# Patient Record
Sex: Female | Born: 1995 | Race: White | Hispanic: No | Marital: Married | Smoking: Never smoker
Health system: Southern US, Community
[De-identification: ages and names within clinical notes are randomized; demographics above are authoritative.]

## PROBLEM LIST (undated history)

## (undated) DIAGNOSIS — N946 Dysmenorrhea, unspecified: Secondary | ICD-10-CM

## (undated) DIAGNOSIS — F329 Major depressive disorder, single episode, unspecified: Secondary | ICD-10-CM

## (undated) DIAGNOSIS — R519 Headache, unspecified: Secondary | ICD-10-CM

## (undated) DIAGNOSIS — Z8719 Personal history of other diseases of the digestive system: Secondary | ICD-10-CM

## (undated) DIAGNOSIS — F32A Depression, unspecified: Secondary | ICD-10-CM

## (undated) DIAGNOSIS — F419 Anxiety disorder, unspecified: Secondary | ICD-10-CM

## (undated) DIAGNOSIS — R51 Headache: Secondary | ICD-10-CM

## (undated) DIAGNOSIS — T8859XA Other complications of anesthesia, initial encounter: Secondary | ICD-10-CM

## (undated) HISTORY — PX: BREAST SURGERY: SHX581

## (undated) HISTORY — PX: GASTROPLASTY DUODENAL SWITCH: SHX1699

## (undated) HISTORY — PX: WISDOM TOOTH EXTRACTION: SHX21

## (undated) HISTORY — DX: Headache: R51

## (undated) HISTORY — PX: ABDOMINAL SURGERY: SHX537

## (undated) HISTORY — DX: Headache, unspecified: R51.9

---
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
## 2013-12-01 HISTORY — PX: BREAST REDUCTION SURGERY: SHX8

## 2014-05-18 IMAGING — CR DG CLAVICLE*R*
1 series · 3 of 3 positions shown · non-contrast
Comparison: none

REASON FOR EXAM: injury
COMMENTS:

PROCEDURE:     MDR - MDR CLAVICLE RIGHT  - November 06, 2012 [DATE]
RESULT:     Right clavicle images demonstrate no fracture, dislocation or
radiopaque foreign body.

[Series 1: ap/pa · 0.17mm/px · 3 of 3 slices shown]
[im 1/3]
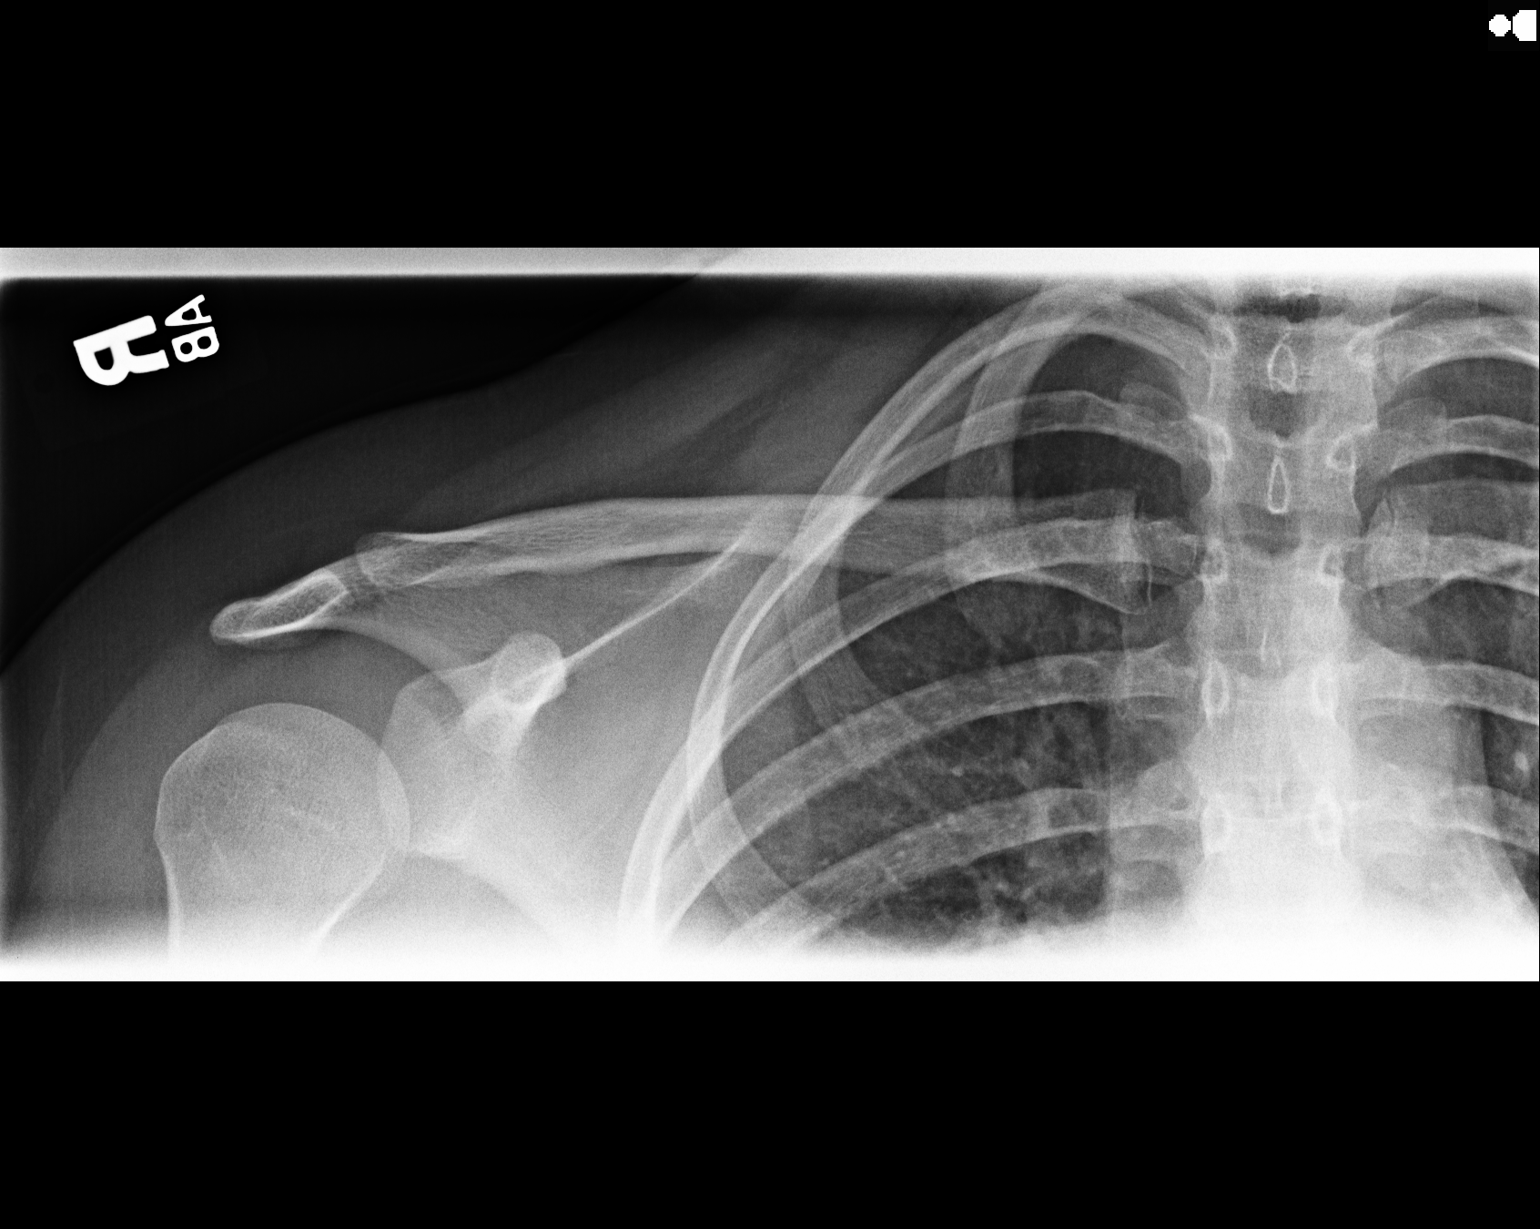
[im 2/3]
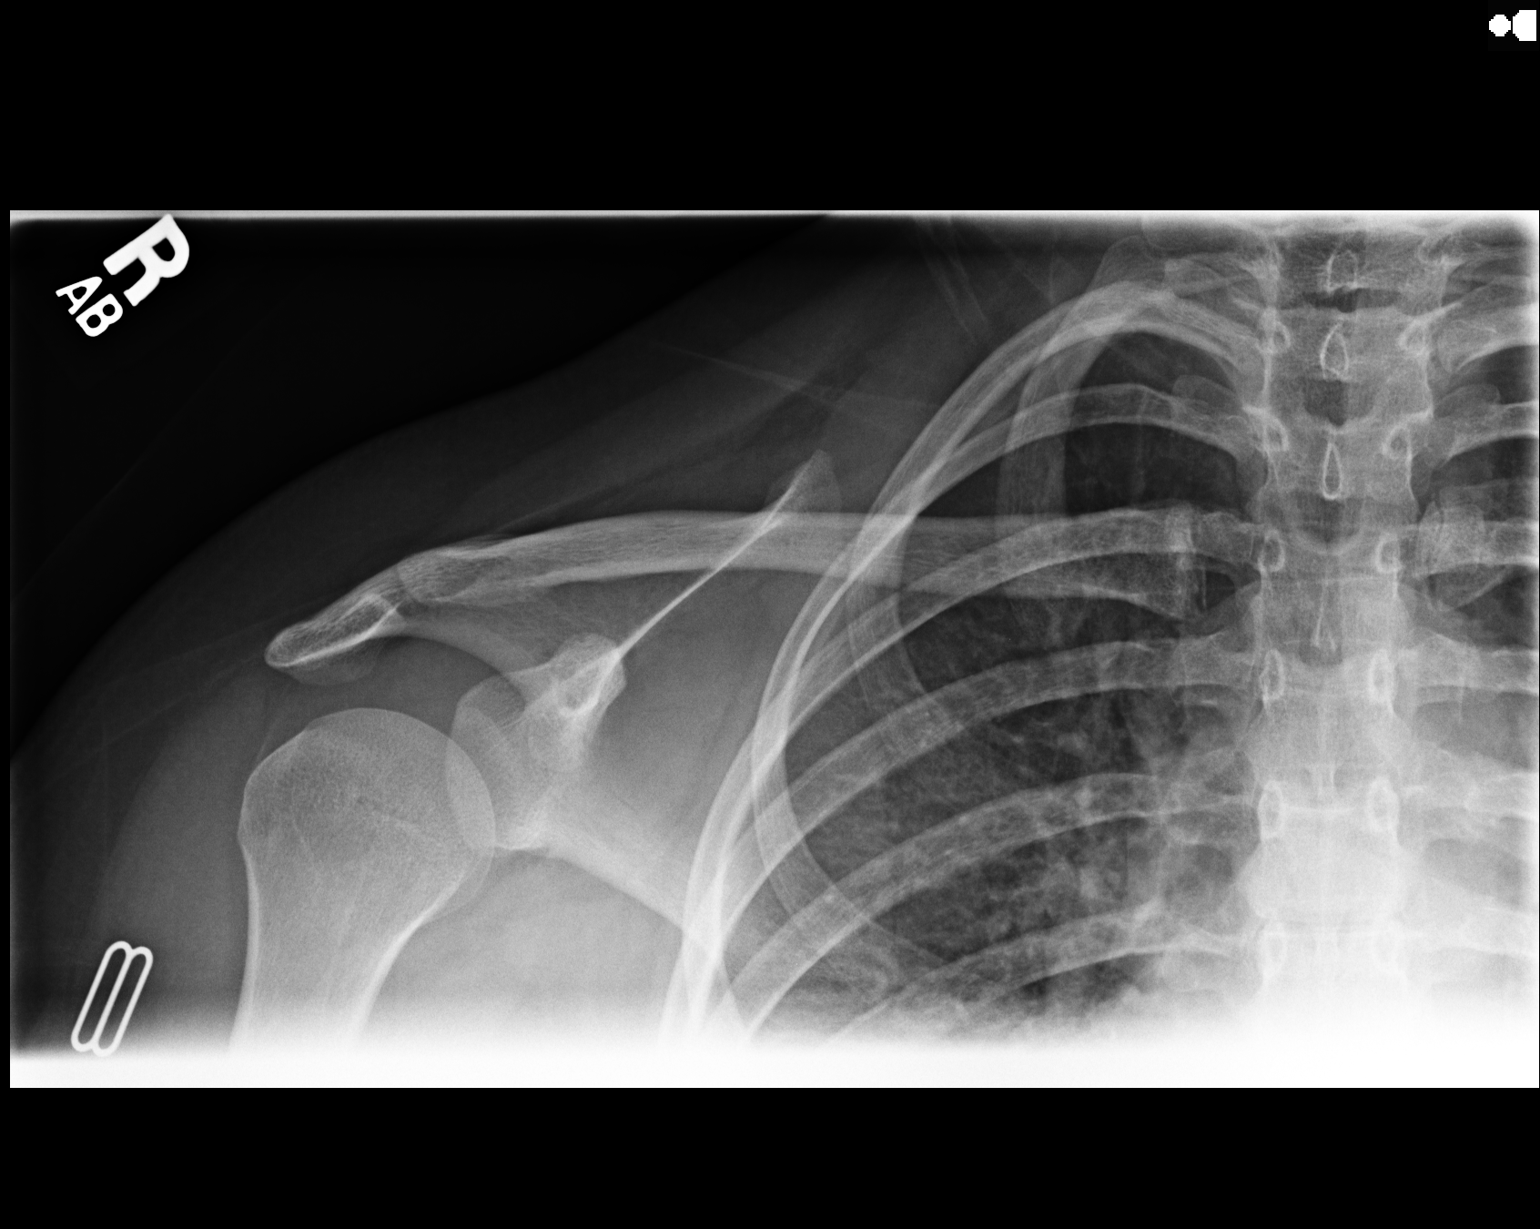
[im 3/3]
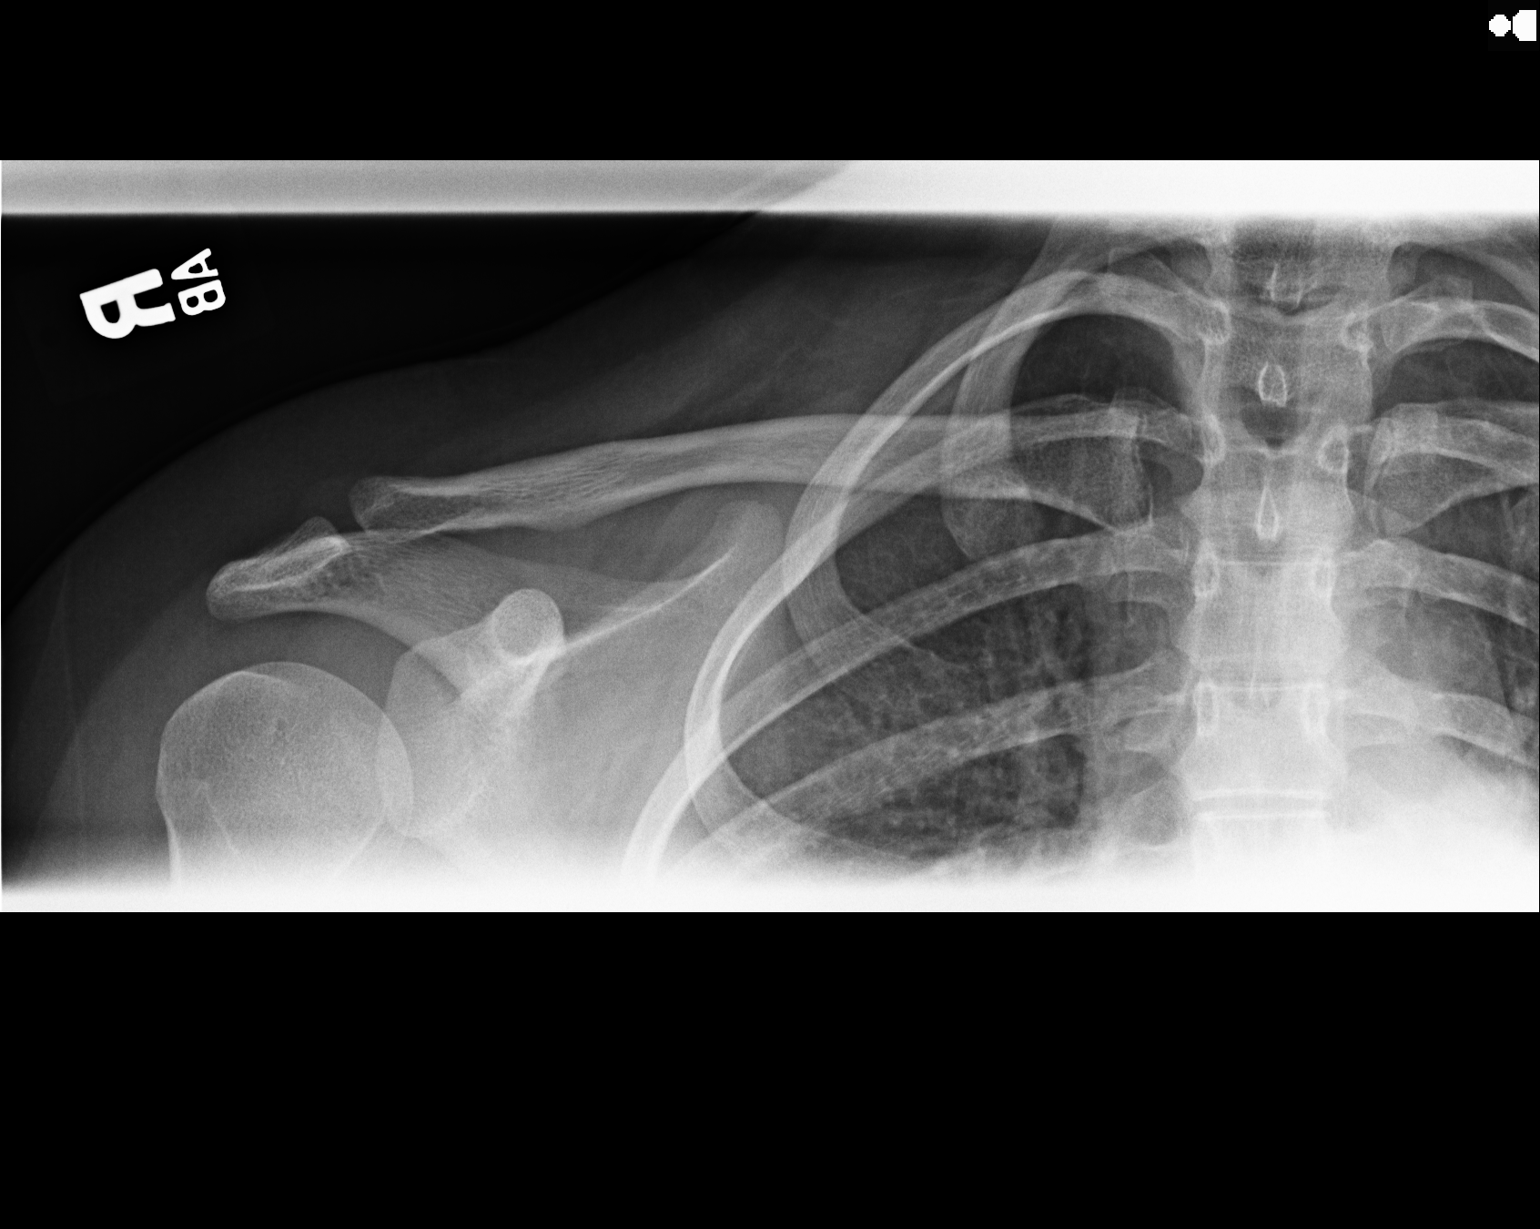

[3 of 3 positions shown; findings below may reference images not displayed]

IMPRESSION: No acute bony abnormality of the right shoulder
demonstrated.

[REDACTED]

## 2014-05-18 IMAGING — CR DG SHOULDER 3+V*R*
1 series · 3 of 3 positions shown · non-contrast
Comparison: none

REASON FOR EXAM: injury
COMMENTS:

PROCEDURE:     MDR - MDR SHOULDER RIGHT COMPLETE  - November 06, 2012 [DATE]
RESULT:     Right shoulder images demonstrate no definite fracture,
dislocation or radiopaque foreign body. There is some right lung base
atelectasis present.

[Series 1: internal rotate · 0.17mm/px · 3 of 3 slices shown]
[im 1/3]
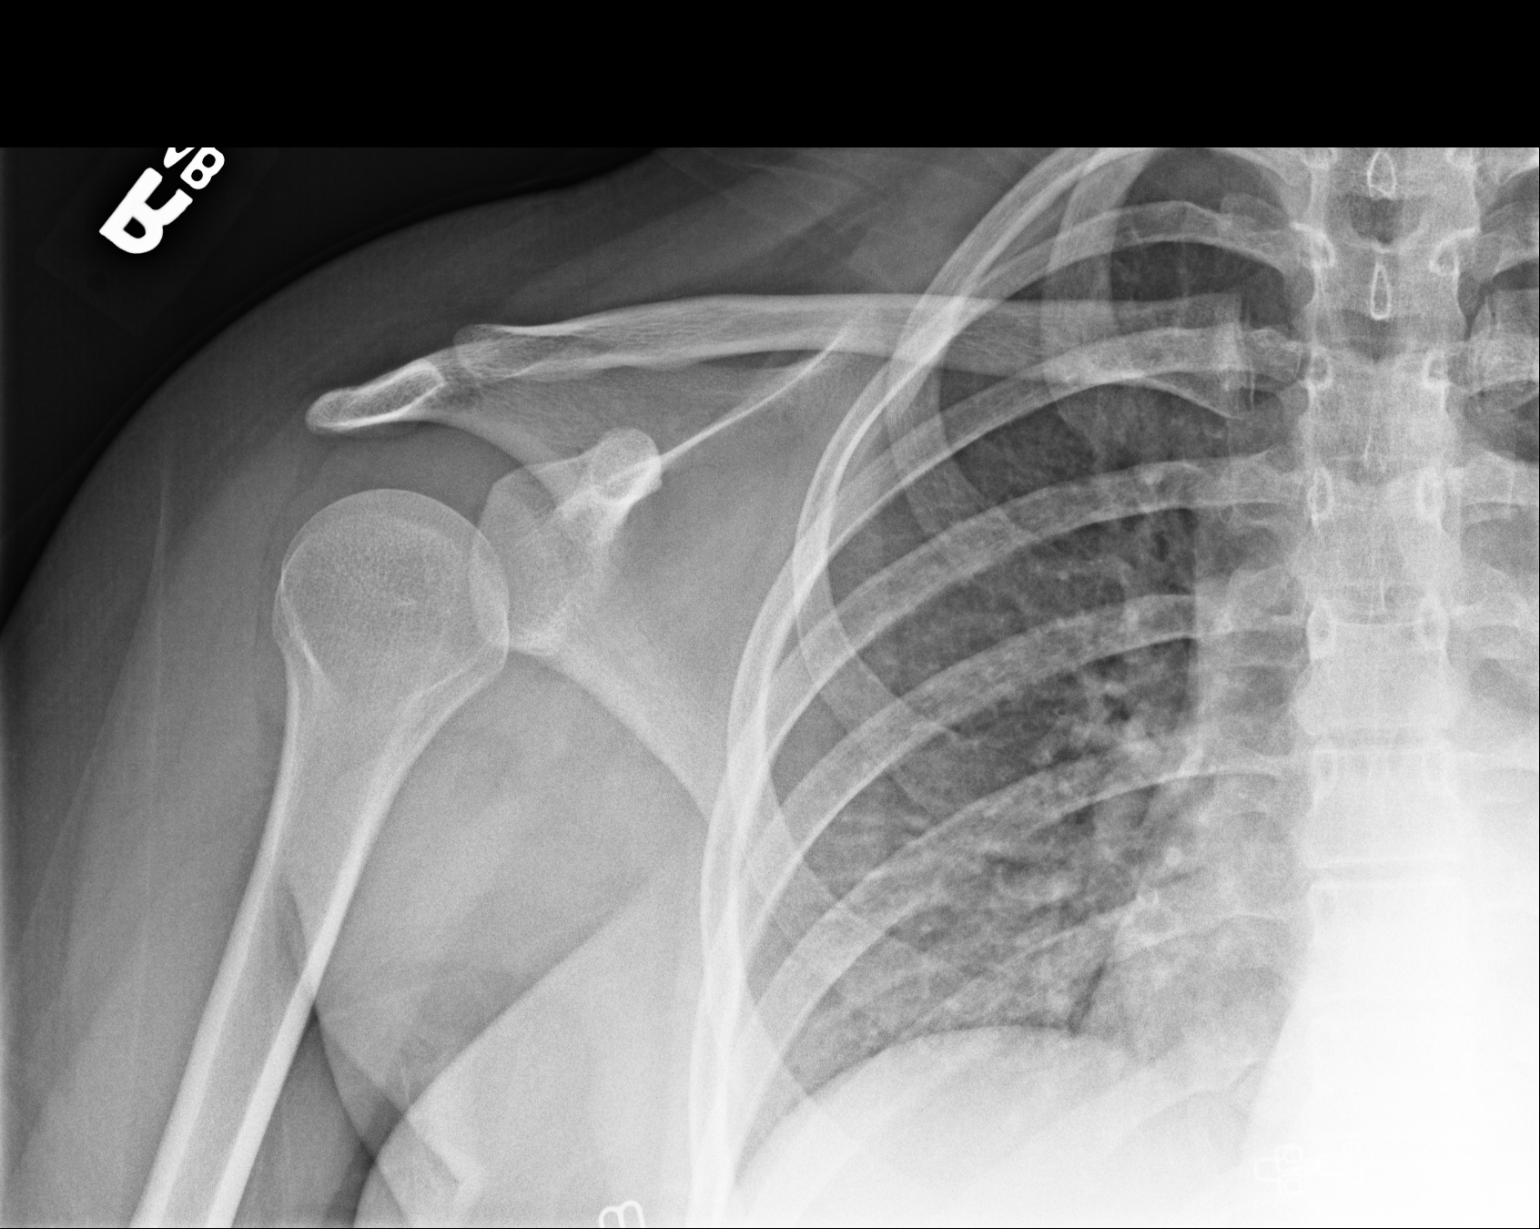
[im 2/3]
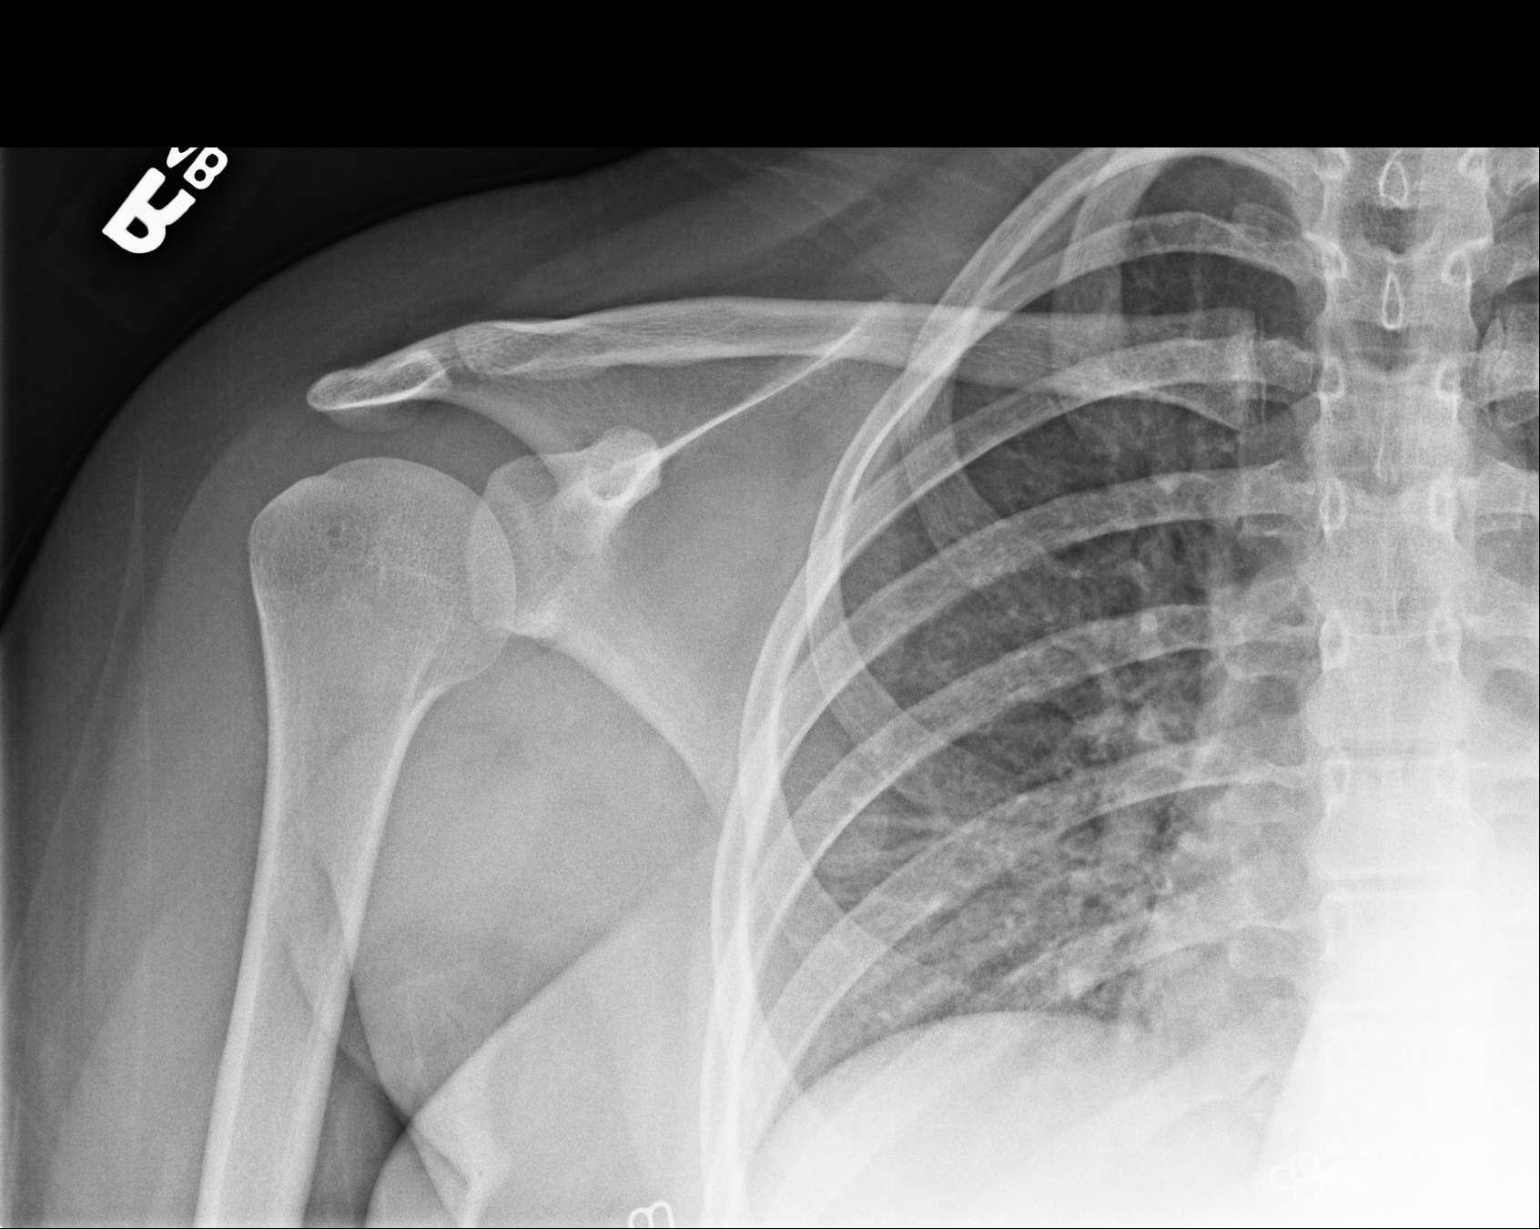
[im 3/3]
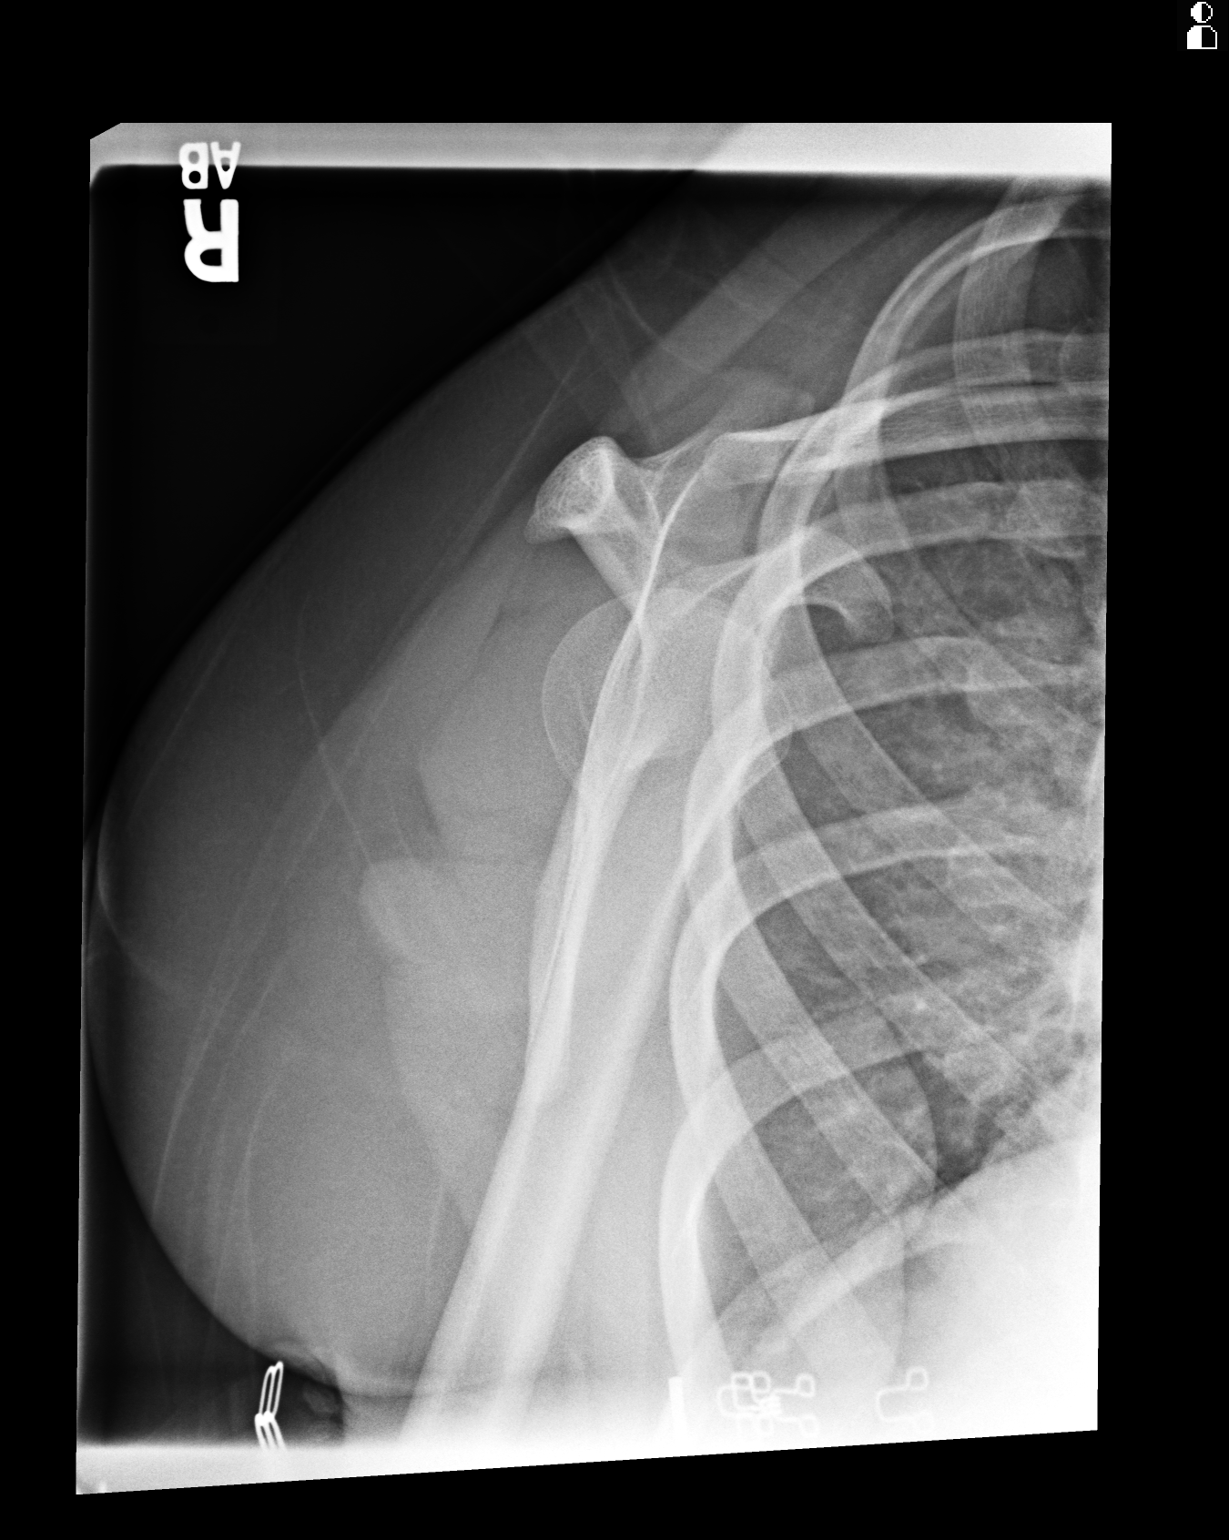

[3 of 3 positions shown; findings below may reference images not displayed]

IMPRESSION: Right lung base atelectasis. No acute bony abnormality
evident in the right shoulder.

[REDACTED]

## 2016-06-17 ENCOUNTER — Other Ambulatory Visit: Payer: Self-pay

## 2016-06-17 ENCOUNTER — Telehealth: Payer: Self-pay | Admitting: Internal Medicine

## 2016-06-17 ENCOUNTER — Other Ambulatory Visit: Payer: Self-pay | Admitting: Internal Medicine

## 2016-06-17 DIAGNOSIS — R300 Dysuria: Secondary | ICD-10-CM

## 2016-06-17 NOTE — Telephone Encounter (Signed)
Patient having nausea abdominal cramping  And increased frequency.

## 2016-06-19 LAB — URINE CULTURE: Organism ID, Bacteria: 10000

## 2016-07-08 ENCOUNTER — Encounter: Payer: Self-pay | Admitting: Internal Medicine

## 2016-07-08 ENCOUNTER — Ambulatory Visit (INDEPENDENT_AMBULATORY_CARE_PROVIDER_SITE_OTHER): Payer: Commercial Managed Care - PPO | Admitting: Internal Medicine

## 2016-07-08 VITALS — BP 118/74 | HR 112 | Temp 98.1°F | Resp 12 | Ht 61.5 in | Wt 279.0 lb

## 2016-07-08 DIAGNOSIS — Z23 Encounter for immunization: Secondary | ICD-10-CM

## 2016-07-08 DIAGNOSIS — Z3169 Encounter for other general counseling and advice on procreation: Secondary | ICD-10-CM

## 2016-07-08 DIAGNOSIS — Z124 Encounter for screening for malignant neoplasm of cervix: Secondary | ICD-10-CM

## 2016-07-08 DIAGNOSIS — E669 Obesity, unspecified: Secondary | ICD-10-CM | POA: Diagnosis not present

## 2016-07-08 DIAGNOSIS — N926 Irregular menstruation, unspecified: Secondary | ICD-10-CM

## 2016-07-08 LAB — LIPID PANEL
CHOLESTEROL: 195 mg/dL (ref 0–200)
HDL: 41 mg/dL (ref >39.00)
LDL CALC: 125 mg/dL — AB (ref 0–99)
NonHDL: 154.07
TRIGLYCERIDES: 143 mg/dL (ref 0.0–149.0)
Total CHOL/HDL Ratio: 5
VLDL: 28.6 mg/dL (ref 0.0–40.0)

## 2016-07-08 LAB — COMPREHENSIVE METABOLIC PANEL
ALBUMIN: 4.2 g/dL (ref 3.5–5.2)
ALK PHOS: 95 U/L (ref 39–117)
ALT: 23 U/L (ref 0–35)
AST: 18 U/L (ref 0–37)
BILIRUBIN TOTAL: 0.3 mg/dL (ref 0.2–1.2)
BUN: 9 mg/dL (ref 6–23)
CALCIUM: 9.1 mg/dL (ref 8.4–10.5)
CO2: 31 mEq/L (ref 19–32)
CREATININE: 0.67 mg/dL (ref 0.40–1.20)
Chloride: 104 mEq/L (ref 96–112)
GFR: 116.32 mL/min (ref >60.00)
Glucose, Bld: 76 mg/dL (ref 70–99)
Potassium: 4.3 mEq/L (ref 3.5–5.1)
SODIUM: 140 meq/L (ref 135–145)
TOTAL PROTEIN: 7.7 g/dL (ref 6.0–8.3)

## 2016-07-08 LAB — TSH: TSH: 1.7 u[IU]/mL (ref 0.35–4.50)

## 2016-07-08 LAB — POCT URINE PREGNANCY: Preg Test, Ur: NEGATIVE

## 2016-07-08 LAB — HEMOGLOBIN A1C: Hgb A1c MFr Bld: 5.3 % (ref 4.6–6.5)

## 2016-07-08 NOTE — Patient Instructions (Addendum)
Make sure you are getting 1 gram of folic acid daily (for neural tube defect prevention  in unborn fetus ) .    Tyr  A low carb protein shake for breakfast  Premier Protein  Atkins Advantadge EAS AdvantEdge   Try substituting a vegetable medley tossed with either butter or [place] salad dressing instead of corn and potatoes   Walking for 30 minutes  Daily make sure you are breathing hard    I recommend a low glycemic index diet  And 30 minutes of vigorous exercise (rapid walking)  Daily to help you achieve your weight loss goals

## 2016-07-08 NOTE — Progress Notes (Signed)
Pre visit review using our clinic review tool, if applicable. No additional management support is needed unless otherwise documented below in the visit note. 

## 2016-07-08 NOTE — Progress Notes (Signed)
Subjective:  Patient ID: Theresa [name], female    DOB: [birth year]  Age: 20 y.o. MRN: [id number]  CC: The primary encounter diagnosis was Menstrual irregularity. Diagnoses of Obesity, Cervical cancer screening, Encounter for immunization, and Encounter for preconception consultation were also pertinent to this visit.  HPI Theresa [name] presents for new patient evaluation .  Referred  by her mother, ChristineEngaged to be married May 24, 2017to an [facility] [facility] [employer] repairman. Hoping to have 2 kids. Not using contraception.  Periods have become irregular In the last month .  No labs in over a year.   No histroy of anemia, asthma, etc.  One overnight hospital stay as a child for some GI issue .  No history of surgeries   Had a Breast reduction in 2014/03/23 which reduced her size from 24F to 38 D.    No prior PAP ,  Has been sexually active since age 47.  Discussed referral to Dr Valentino Saxon or Melody Ines Bloomer Imagene Riches given her plans to eventually conceive   Father's health unknown, died in 03-24-2003 when she was young.  PGM had some type of neck surgery, not thyroid CA.  PGF has COPD, both still alive.  MGM and MF unknown ,  Not in contact  . Mother has eating disorder and underactive thyroid.    Cc: stress induced headaches relieved with relaxation and deep breathing.  Does not wake up in AM with headache and denies  daytime somnolence. 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     Obeisty:  Has trouble maintaining weight loss.  [name] is supportive emotionally and encourages her to walk .  Has been avoiding fast food. Doet revoewed   Avoids breakfast bc not hungry.  Eats a ham sub 6 inch,  from [place]  And water .  No snacks except  6-7 Cheesits in the afternoon.  Dinner is usually  Chicken green beans corn ad baked potatoe   Walking 1.5 miles every night (30 minutes ) every  Night  And swims 2 -3 times per week   Has lost 8 lbs so far.   History [name] has a past medical history of Frequent headaches.   She has a past surgical  history that includes Breast reduction surgery (12/2013).   Her family history is not on file.She reports that she has never smoked. She has never used smokeless tobacco. She reports that she drinks alcohol. She reports that she does not use drugs.  No outpatient prescriptions prior to visit.   No facility-administered medications prior to visit.     Review of Systems:  Patient denies headache, fevers, malaise, unintentional weight loss, skin rash, eye pain, sinus congestion and sinus pain, sore throat, dysphagia,  hemoptysis , cough, dyspnea, wheezing, chest pain, palpitations, orthopnea, edema, abdominal pain, nausea, melena, diarrhea, constipation, flank pain, dysuria, hematuria, urinary  Frequency, nocturia, numbness, tingling, seizures,  Focal weakness, Loss of consciousness,  Tremor, insomnia, depression, anxiety, and suicidal ideation.     Objective:  BP 118/74 (BP Location: Left Arm, Patient Position: Sitting, Cuff Size: Normal)   Pulse (!) 112   Temp 98.1 F (36.7 C) (Oral)   Resp 12   Ht 5' 1.5" (1.562 m)   Wt 279 lb (126.6 kg)   LMP 04/25/2016   SpO2 99%   BMI 51.86 kg/m   Physical Exam:  General appearance: alert, cooperative and morbidly obese.  appears stated age Throat: lips, mucosa, and tongue normal; teeth and gums normal Neck: no adenopathy, no carotid bruit,  supple, symmetrical, trachea midline and thyroid not enlarged, symmetric, no tenderness/mass/nodules Back: symmetric, no curvature. ROM normal. No CVA tenderness. Lungs: clear to auscultation bilaterally Heart: regular rate and rhythm, S1, S2 normal, no murmur, click, rub or gallop Abdomen: soft, non-tender; bowel sounds normal; no masses,  no organomegaly Pulses: 2+ and symmetric Skin: Skin color, texture, turgor normal. No rashes or lesions Lymph nodes: Cervical, supraclavicular, and axillary nodes normal.   Assessment & Plan:   Problem List Items Addressed This Visit    Menstrual irregularity -  Primary    Her period was late for the first time last week , which she disclosed when she accompanied her mother to her appointment .  POCT  UPT was negative      Relevant Orders   POCT urine pregnancy (Completed)   Comprehensive metabolic panel (Completed)   TSH (Completed)   Obesity    Morbid. I have addressed  BMI and recommended wt loss of 10% of body weight over the next 6 months using a low fat, low starch, high protein  fruit/vegetable based Mediterranean diet and 30 minutes of aerobic exercise a minimum of 5 days per week. Screening for thyroid, DM. Lipids , fatty liver done  Lab Results  Component Value Date   HGBA1C 5.3 07/08/2016   Lab Results  Component Value Date   TSH 1.70 07/08/2016   Lab Results  Component Value Date   CHOL 195 07/08/2016   HDL 41.00 07/08/2016   LDLCALC 125 (H) 07/08/2016   TRIG 143.0 07/08/2016   CHOLHDL 5 07/08/2016   Lab Results  Component Value Date   CREATININE 0.67 07/08/2016   Lab Results  Component Value Date   CHOL 195 07/08/2016   HDL 41.00 07/08/2016   LDLCALC 125 (H) 07/08/2016   TRIG 143.0 07/08/2016   CHOLHDL 5 07/08/2016           Relevant Orders   Hemoglobin A1c (Completed)   Lipid panel (Completed)   Cervical cancer screening    No prior screening despite being sexually active since age 20.  Given her BMI>50 and her preconception state, will refer to [facility]  For GYN care. Requesting a female provider      Relevant Orders   Ambulatory referral to Obstetrics / Gynecology   Encounter for immunization   Relevant Orders   Flu Vaccine QUAD 36+ mos IM (Completed)   Encounter for preconception consultation    She is not using contraception .  Advised to start prenatal vitamins        Other Visit Diagnoses   None.    A total of 45 minutes was spent with patient more than half of which was spent in counseling patient on the above mentioned issues  and coordination of care.  [name] does not currently have  medications on file.  No orders of the defined types were placed in this encounter.   There are no discontinued medications.  Follow-up: No Follow-up on file.   Sherlene ShamsULLO, Saida Lonon L, MD

## 2016-07-10 DIAGNOSIS — E669 Obesity, unspecified: Secondary | ICD-10-CM | POA: Insufficient documentation

## 2016-07-10 DIAGNOSIS — N926 Irregular menstruation, unspecified: Secondary | ICD-10-CM | POA: Insufficient documentation

## 2016-07-10 DIAGNOSIS — Z23 Encounter for immunization: Secondary | ICD-10-CM | POA: Insufficient documentation

## 2016-07-10 DIAGNOSIS — Z124 Encounter for screening for malignant neoplasm of cervix: Secondary | ICD-10-CM | POA: Insufficient documentation

## 2016-07-10 DIAGNOSIS — Z3169 Encounter for other general counseling and advice on procreation: Secondary | ICD-10-CM | POA: Insufficient documentation

## 2016-07-10 NOTE — Assessment & Plan Note (Signed)
She is not using contraception .  Advised to start prenatal vitamins

## 2016-07-10 NOTE — Assessment & Plan Note (Addendum)
Morbid. I have addressed  BMI and recommended wt loss of 10% of body weight over the next 6 months using a low fat, low starch, high protein  fruit/vegetable based Mediterranean diet and 30 minutes of aerobic exercise a minimum of 5 days per week. Screening for thyroid, DM. Lipids , fatty liver done  Lab Results  Component Value Date   HGBA1C 5.3 07/08/2016   Lab Results  Component Value Date   TSH 1.70 07/08/2016   Lab Results  Component Value Date   CHOL 195 07/08/2016   HDL 41.00 07/08/2016   LDLCALC 125 (H) 07/08/2016   TRIG 143.0 07/08/2016   CHOLHDL 5 07/08/2016   Lab Results  Component Value Date   CREATININE 0.67 07/08/2016   Lab Results  Component Value Date   CHOL 195 07/08/2016   HDL 41.00 07/08/2016   LDLCALC 125 (H) 07/08/2016   TRIG 143.0 07/08/2016   CHOLHDL 5 07/08/2016

## 2016-07-10 NOTE — Assessment & Plan Note (Signed)
No prior screening despite being sexually active since age 20.  Given her BMI>50 and her preconception state, will refer to [facility]  For GYN care. Requesting a female provider

## 2016-07-10 NOTE — Assessment & Plan Note (Signed)
Her period was late for the first time last week , which she disclosed when she accompanied her mother to her appointment .  POCT  UPT was negative

## 2016-09-26 ENCOUNTER — Ambulatory Visit (INDEPENDENT_AMBULATORY_CARE_PROVIDER_SITE_OTHER): Payer: Commercial Managed Care - PPO | Admitting: Internal Medicine

## 2016-09-26 ENCOUNTER — Encounter: Payer: Self-pay | Admitting: Internal Medicine

## 2016-09-26 DIAGNOSIS — N926 Irregular menstruation, unspecified: Secondary | ICD-10-CM

## 2016-09-26 DIAGNOSIS — F411 Generalized anxiety disorder: Secondary | ICD-10-CM

## 2016-09-26 DIAGNOSIS — R0683 Snoring: Secondary | ICD-10-CM | POA: Diagnosis not present

## 2016-09-26 LAB — POCT URINE PREGNANCY: Preg Test, Ur: NEGATIVE

## 2016-09-26 MED ORDER — ALPRAZOLAM 0.25 MG PO TABS: 0.2500 mg | ORAL_TABLET | Freq: Two times a day (BID) | ORAL | 0 refills | Status: DC | PRN

## 2016-09-26 MED ORDER — SERTRALINE HCL 25 MG PO TABS: 25.0000 mg | ORAL_TABLET | Freq: Every day | ORAL | 0 refills | Status: DC

## 2016-09-26 NOTE — Patient Instructions (Signed)
I am treating your panic attacks and anxeity with 2 medications  1) Generic Zoloft  (Sertraline)  start with 1/2 tablet in the evening .  Increase to a full tablet  after 4 days if you are not having  nausea.   Increase dose to 50 mg after 2 weeks on the 25 mg dose  2) Generic Xanax ( alprazolam ) use sparingly,  Only as needed, for panic attacks .  Do not drink alcohol or share with others.   RTC one month    Panic Attacks Panic attacks are sudden, short feelings of great fear or discomfort. You may have them for no reason when you are relaxed, when you are uneasy (anxious), or when you are sleeping. Follow these instructions at home:  Take all your medicines as told.  Check with your doctor before starting new medicines.  Keep all doctor visits. Contact a doctor if:  You are not able to take your medicines as told.  Your symptoms do not get better.  Your symptoms get worse. Get help right away if:  Your attacks seem different than your normal attacks.  You have thoughts about hurting yourself or others.  You take panic attack medicine and you have a side effect. This information is not intended to replace advice given to you by your health care provider. Make sure you discuss any questions you have with your health care provider. Document Released: 11/19/2010 Document Revised: 03/24/2016 Document Reviewed: 05/31/2013 Elsevier Interactive Patient Education  2017 [employer]

## 2016-09-26 NOTE — Progress Notes (Addendum)
Subjective:  Patient ID: Theresa [name], female    DOB: [birth year]  Age: 20 y.o. MRN: [id number]  CC: Diagnoses of Menstrual irregularity, Generalized anxiety disorder, and Snoring were pertinent to this visit.  HPI Theresa [name] presents for evaluation of amenorrhea.  Last seen in September , was having menstrual irregularity.  [facility] > [facility]  . Patient deferred GYN referral because of [facility] but has not made an appointment yet.  Fiance says she snores .  Fiance has OSA but refuses to get it treated  Panic attacks occurring 2-3 times weekly for over 4 years .   Dad died suddenly when she was 20 years old, [place] struck [place] train [place].     No outpatient prescriptions prior to visit.   No facility-administered medications prior to visit.     Review of Systems;  Patient denies headache, fevers, malaise, unintentional weight loss, skin rash, eye pain, sinus congestion and sinus pain, sore throat, dysphagia,  hemoptysis , cough, dyspnea, wheezing, chest pain, palpitations, orthopnea, edema, abdominal pain, nausea, melena, diarrhea, constipation, flank pain, dysuria, hematuria, urinary  Frequency, nocturia, numbness, tingling, seizures,  Focal weakness, Loss of consciousness,  Tremor, insomnia, depression, anxiety, and suicidal ideation.      Objective:  BP (!) 148/80   Pulse 83   Temp 97.6 F (36.4 C) (Oral)   Resp 12   Ht 5\' 2"  (1.575 m)   Wt 281 lb (127.5 kg)   LMP 05/30/2016 (Approximate)   SpO2 98%   BMI 51.40 kg/m   BP Readings from Last 3 Encounters:  11/04/16 110/76  09/26/16 (!) 148/80  07/08/16 118/74    Wt Readings from Last 3 Encounters:  11/04/16 279 lb 4 oz (126.7 kg)  09/26/16 281 lb (127.5 kg)  07/08/16 279 lb (126.6 kg)    General appearance: alert, cooperative and appears stated age Ears: normal TM's and external ear canals both ears Throat: lips, mucosa, and tongue  normal; teeth and gums normal Neck: no adenopathy, no carotid bruit, supple, symmetrical, trachea midline and thyroid not enlarged, symmetric, no tenderness/mass/nodules Back: symmetric, no curvature. ROM normal. No CVA tenderness. Lungs: clear to auscultation bilaterally Heart: regular rate and rhythm, S1, S2 normal, no murmur, click, rub or gallop Abdomen: soft, non-tender; bowel sounds normal; no masses,  no organomegaly Pulses: 2+ and symmetric Skin: Skin color, texture, turgor normal. No rashes or lesions Lymph nodes: Cervical, supraclavicular, and axillary nodes normal. Psych: affect normal, makes good eye contact. No fidgeting,   Denies suicidal thoughts   Lab Results  Component Value Date   HGBA1C 5.3 07/08/2016    Lab Results  Component Value Date   CREATININE 0.67 07/08/2016    Lab Results  Component Value Date   GLUCOSE 76 07/08/2016   CHOL 195 07/08/2016   TRIG 143.0 07/08/2016   HDL 41.00 07/08/2016   LDLCALC 125 (H) 07/08/2016   ALT 23 07/08/2016   AST 18 07/08/2016   NA 140 07/08/2016   K 4.3 07/08/2016   CL 104 07/08/2016   CREATININE 0.67 07/08/2016   BUN 9 07/08/2016   CO2 31 07/08/2016   TSH 1.70 07/08/2016   HGBA1C 5.3 07/08/2016    Assessment & Plan:   Problem List Items Addressed This Visit    Generalized anxiety disorder    With panic attacks.  Alprazolam and sertraline prescribed      Menstrual  irregularity    Advised her that she is not pregnant,  PCOS the likely cause.  Encourage weight loss, follow up with  Gyn.       Relevant Orders   POCT urine pregnancy (Completed)   Snoring    Recommend referral for sleep study         I am having Theresa [name] start on ALPRAZolam and sertraline.  Meds ordered this encounter  Medications  . ALPRAZolam (XANAX) 0.25 MG tablet    Sig: Take 1 tablet (0.25 mg total) by mouth 2 (two) times daily as needed for anxiety.    Dispense:  20 tablet    Refill:  0  . sertraline (ZOLOFT) 25 MG tablet     Sig: Take 1 tablet (25 mg total) by mouth daily.    Dispense:  90 tablet    Refill:  0    There are no discontinued medications.  Follow-up: Return in about 4 weeks (around 10/24/2016).   Sherlene ShamsULLO, Karsen Nakanishi L, MD

## 2016-09-26 NOTE — Progress Notes (Signed)
Pre-visit discussion using our clinic review tool. No additional management support is needed unless otherwise documented below in the visit note.  

## 2016-09-27 DIAGNOSIS — R0683 Snoring: Secondary | ICD-10-CM | POA: Insufficient documentation

## 2016-09-27 DIAGNOSIS — F411 Generalized anxiety disorder: Secondary | ICD-10-CM | POA: Insufficient documentation

## 2016-09-27 NOTE — Assessment & Plan Note (Addendum)
With panic attacks.  Alprazolam and sertraline prescribed

## 2016-09-27 NOTE — Assessment & Plan Note (Signed)
Advised her that she is not pregnant,  PCOS the likely cause.  Encourage weight loss, follow up with  Gyn.

## 2016-09-27 NOTE — Assessment & Plan Note (Signed)
Recommend referral for sleep study

## 2016-11-04 ENCOUNTER — Ambulatory Visit (INDEPENDENT_AMBULATORY_CARE_PROVIDER_SITE_OTHER): Payer: Commercial Managed Care - PPO | Admitting: Internal Medicine

## 2016-11-04 ENCOUNTER — Encounter: Payer: Self-pay | Admitting: Internal Medicine

## 2016-11-04 VITALS — BP 110/76 | HR 92 | Temp 98.4°F | Resp 16 | Ht 62.0 in | Wt 279.2 lb

## 2016-11-04 DIAGNOSIS — F4321 Adjustment disorder with depressed mood: Secondary | ICD-10-CM

## 2016-11-04 DIAGNOSIS — F411 Generalized anxiety disorder: Secondary | ICD-10-CM

## 2016-11-04 DIAGNOSIS — F4320 Adjustment disorder, unspecified: Secondary | ICD-10-CM | POA: Diagnosis not present

## 2016-11-04 DIAGNOSIS — Z634 Disappearance and death of family member: Secondary | ICD-10-CM

## 2016-11-04 DIAGNOSIS — N926 Irregular menstruation, unspecified: Secondary | ICD-10-CM | POA: Diagnosis not present

## 2016-11-04 DIAGNOSIS — F4329 Adjustment disorder with other symptoms: Secondary | ICD-10-CM | POA: Diagnosis not present

## 2016-11-04 DIAGNOSIS — F4381 Prolonged grief disorder: Secondary | ICD-10-CM

## 2016-11-04 MED ORDER — ALPRAZOLAM 0.25 MG PO TABS: 0.2500 mg | ORAL_TABLET | Freq: Two times a day (BID) | ORAL | 2 refills | Status: DC | PRN

## 2016-11-04 MED ORDER — SERTRALINE HCL 50 MG PO TABS: 50.0000 mg | ORAL_TABLET | Freq: Every day | ORAL | 1 refills | Status: DC

## 2016-11-04 NOTE — Patient Instructions (Addendum)
I'm glad you are tolerating the medication  I am increaesing the zoloft to 50 mg daily  Continue takign it in the evening.  I have refilled the alprazolam for "prn"  Use (use only as needed for extreme anxiety, becuasre it is addicting)  You will need ot see me every 3 months to receive additional refills on the alprazolam  I thinking grief counselling will help you with your grandfather's passing and have made the referral   For your post nasal drip, try taking 25 mg benadryl 30 to 60 minutes before bedtime,  It will dry you up and help you sleep

## 2016-11-04 NOTE — Progress Notes (Signed)
Subjective:  Patient ID: Theresa [name], female    DOB: 22-Jul-1996  Age: 21 y.o. MRN: [id number]  CC: The primary encounter diagnosis was Complicated grief. Diagnoses of Menstrual irregularity, Generalized anxiety disorder, and Grief reaction were also pertinent to this visit.  HPI Lynde Wayne Sever presents for management of GAD. Her last visit was 6 weeks ago and zoloft/.alprazolam started   Tolerating meds.  Has used the alprazolam prn for panic attacks. Her anxiety was aggravated by the death of her Grandfather who  died 09-15-2023  Discussed grief counselling   Mensese have returned after several months of amenorrhea. .    Outpatient Medications Prior to Visit  Medication Sig Dispense Refill  . ALPRAZolam (XANAX) 0.25 MG tablet Take 1 tablet (0.25 mg total) by mouth 2 (two) times daily as needed for anxiety. 20 tablet 0  . sertraline (ZOLOFT) 25 MG tablet Take 1 tablet (25 mg total) by mouth daily. 90 tablet 0   No facility-administered medications prior to visit.     Review of Systems;  Patient denies headache, fevers, malaise, unintentional weight loss, skin rash, eye pain, sinus congestion and sinus pain, sore throat, dysphagia,  hemoptysis , cough, dyspnea, wheezing, chest pain, palpitations, orthopnea, edema, abdominal pain, nausea, melena, diarrhea, constipation, flank pain, dysuria, hematuria, urinary  Frequency, nocturia, numbness, tingling, seizures,  Focal weakness, Loss of consciousness,  Tremor, insomnia, depression, anxiety, and suicidal ideation.      Objective:  BP 110/76   Pulse 92   Temp 98.4 F (36.9 C) (Oral)   Resp 16   Ht 5\' 2"  (1.575 m)   Wt 279 lb 4 oz (126.7 kg)   LMP 11/04/2016   SpO2 97%   BMI 51.08 kg/m   BP Readings from Last 3 Encounters:  11/04/16 110/76  09/26/16 (!) 148/80  07/08/16 118/74    Wt Readings from Last 3 Encounters:  11/04/16 279 lb 4 oz (126.7 kg)  09/26/16 281 lb (127.5 kg)  07/08/16 279 lb (126.6 kg)    General  appearance: alert, cooperative and appears stated age Lungs: clear to auscultation bilaterally Heart: regular rate and rhythm, S1, S2 normal, no murmur, click, rub or gallop Abdomen: soft, non-tender; bowel sounds normal; no masses,  no organomegaly Psych: affect normal, makes good eye contact. No fidgeting,  Smiles easily.  Denies suicidal thoughts   Lab Results  Component Value Date   HGBA1C 5.3 07/08/2016    Lab Results  Component Value Date   CREATININE 0.67 07/08/2016    Lab Results  Component Value Date   GLUCOSE 76 07/08/2016   CHOL 195 07/08/2016   TRIG 143.0 07/08/2016   HDL 41.00 07/08/2016   LDLCALC 125 (H) 07/08/2016   ALT 23 07/08/2016   AST 18 07/08/2016   NA 140 07/08/2016   K 4.3 07/08/2016   CL 104 07/08/2016   CREATININE 0.67 07/08/2016   BUN 9 07/08/2016   CO2 31 07/08/2016   TSH 1.70 07/08/2016   HGBA1C 5.3 07/08/2016    Assessment & Plan:   Problem List Items Addressed This Visit    Generalized anxiety disorder    Complicated by grief. Improved with initiation of ssri therapy and prn alprazolam. Side effects of nausea eventually resolved with time.   Increasing zoloft dose to 50 mg daily  The risks and benefits of benzodiazepine use were discussed with patient today including excessive sedation leading to respiratory depression,  impaired thinking/driving, and addiction.  Patient was advised to avoid concurrent  use with alcohol, to use medication only as needed and not to share with others  .       Grief reaction    Secondary to los of grandfather . Grief counselling recommended.       Menstrual irregularity    Likely secondary to PCOS and emotional stressors,  Menses have been restored to regular occurrence.        Other Visit Diagnoses    Complicated grief    -  Primary   Relevant Orders   Ambulatory referral to Psychology      I have changed Ms. Agnew's sertraline. I am also having her maintain her ALPRAZolam.  Meds ordered this  encounter  Medications  . ALPRAZolam (XANAX) 0.25 MG tablet    Sig: Take 1 tablet (0.25 mg total) by mouth 2 (two) times daily as needed for anxiety.    Dispense:  60 tablet    Refill:  2  . sertraline (ZOLOFT) 50 MG tablet    Sig: Take 1 tablet (50 mg total) by mouth daily.    Dispense:  90 tablet    Refill:  1   A total of 25 minutes of face to face time was spent with patient more than half of which was spent in counselling about the above mentioned conditions  and coordination of care  Medications Discontinued During This Encounter  Medication Reason  . ALPRAZolam (XANAX) 0.25 MG tablet Reorder  . sertraline (ZOLOFT) 25 MG tablet Reorder    Follow-up: No Follow-up on file.   Sherlene ShamsULLO, Caro Brundidge L, MD

## 2016-11-06 DIAGNOSIS — F4321 Adjustment disorder with depressed mood: Secondary | ICD-10-CM | POA: Insufficient documentation

## 2016-11-06 DIAGNOSIS — F4320 Adjustment disorder, unspecified: Secondary | ICD-10-CM | POA: Insufficient documentation

## 2016-11-06 NOTE — Assessment & Plan Note (Addendum)
Complicated by grief. Improved with initiation of ssri therapy and prn alprazolam. Side effects of nausea eventually resolved with time.   Increasing zoloft dose to 50 mg daily  The risks and benefits of benzodiazepine use were discussed with patient today including excessive sedation leading to respiratory depression,  impaired thinking/driving, and addiction.  Patient was advised to avoid concurrent use with alcohol, to use medication only as needed and not to share with others  .

## 2016-11-06 NOTE — Assessment & Plan Note (Signed)
Secondary to los of grandfather . Grief counselling recommended.

## 2016-11-06 NOTE — Assessment & Plan Note (Signed)
Likely secondary to PCOS and emotional stressors,  Menses have been restored to regular occurrence.

## 2017-05-04 ENCOUNTER — Ambulatory Visit: Payer: Commercial Managed Care - PPO | Admitting: Internal Medicine

## 2017-05-04 DIAGNOSIS — Z0289 Encounter for other administrative examinations: Secondary | ICD-10-CM

## 2018-11-09 ENCOUNTER — Ambulatory Visit
Admission: EM | Admit: 2018-11-09 | Discharge: 2018-11-09 | Disposition: A | Discharge status: Home / Self Care | Payer: BLUE CROSS/BLUE SHIELD | Attending: Family Medicine | Admitting: Family Medicine

## 2018-11-09 ENCOUNTER — Encounter: Payer: Self-pay | Admitting: Emergency Medicine

## 2018-11-09 ENCOUNTER — Other Ambulatory Visit: Payer: Self-pay

## 2018-11-09 DIAGNOSIS — R509 Fever, unspecified: Secondary | ICD-10-CM | POA: Diagnosis not present

## 2018-11-09 DIAGNOSIS — R0981 Nasal congestion: Secondary | ICD-10-CM

## 2018-11-09 DIAGNOSIS — H9201 Otalgia, right ear: Secondary | ICD-10-CM

## 2018-11-09 DIAGNOSIS — R05 Cough: Secondary | ICD-10-CM

## 2018-11-09 DIAGNOSIS — J069 Acute upper respiratory infection, unspecified: Secondary | ICD-10-CM

## 2018-11-09 DIAGNOSIS — J329 Chronic sinusitis, unspecified: Secondary | ICD-10-CM

## 2018-11-09 MED ORDER — FLUTICASONE PROPIONATE 50 MCG/ACT NA SUSP: 2.0000 | Freq: Every day | NASAL | 0 refills | Status: DC

## 2018-11-09 MED ORDER — PSEUDOEPHEDRINE-GUAIFENESIN ER 60-600 MG PO TB12: 1.0000 | ORAL_TABLET | Freq: Two times a day (BID) | ORAL | 0 refills | Status: DC

## 2018-11-09 MED ORDER — BENZONATATE 200 MG PO CAPS: ORAL_CAPSULE | ORAL | 0 refills | Status: DC

## 2018-11-09 MED ORDER — HYDROCOD POLST-CPM POLST ER 10-8 MG/5ML PO SUER: 5.0000 mL | Freq: Two times a day (BID) | ORAL | 0 refills | Status: DC

## 2018-11-09 NOTE — ED Triage Notes (Signed)
Patient c/o sinus congestion and pressure, runny nose, HAs and right ear pain that started on Wed. Patient denies fevers.

## 2018-11-09 NOTE — ED Provider Notes (Signed)
[facility]    CSN: [id number] Arrival date & time: 11/09/18  0945     History   Chief Complaint Chief Complaint  Patient presents with  . Sinus Problem  . Headache    HPI Theresa [name] is a 23 y.o. female.   HPI  -year-old female presents with a 2-day history of sinus congestion and pressure runny nose headaches and right ear pain.  She has had no fevers.  She has had coughing mostly at nighttime which is tended to keep her awake.  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  Last night she felt feverish but did not measure her temperature         History reviewed. No pertinent past medical history.  There are no active problems to display for this patient.   Past Surgical History:  Procedure Laterality Date  . BREAST SURGERY    . WISDOM TOOTH EXTRACTION      OB History   No obstetric history on file.      Home Medications    Prior to Admission medications   Medication Sig Start Date End Date Taking? Authorizing Provider  benzonatate (TESSALON) 200 MG capsule Take one cap TID PRN cough 11/09/18   Lutricia Feil, PA-C  chlorpheniramine-HYDROcodone [facility] ER) 10-8 MG/5ML SUER Take 5 mLs by mouth 2 (two) times daily. 11/09/18   Lutricia Feil, PA-C  fluticasone (FLONASE) 50 MCG/ACT nasal spray Place 2 sprays into both nostrils daily. 11/09/18   Lutricia Feil, PA-C  pseudoephedrine-guaifenesin (MUCINEX D) 60-600 MG 12 hr tablet Take 1 tablet by mouth every 12 (twelve) hours. 11/09/18   Lutricia Feil, PA-C    Family History Family History  Problem Relation Age of Onset  . Depression Mother     Social History Social History   Tobacco Use  . Smoking status: Never Smoker  . Smokeless tobacco: Never Used  Substance Use Topics  . Alcohol use: Yes  . Drug use: Never     Allergies   Patient has no known allergies.   Review of Systems Review of Systems  Constitutional: Positive for activity change,  chills, fatigue and fever.  HENT: Positive for congestion, ear pain, postnasal drip, rhinorrhea, sinus pressure and sinus pain.   Respiratory: Positive for cough.   All other systems reviewed and are negative.    Physical Exam Triage Vital Signs ED Triage Vitals  Enc Vitals Group     BP 11/09/18 0959 (!) 150/99     Pulse Rate 11/09/18 0959 82     Resp 11/09/18 0959 16     Temp 11/09/18 0959 97.9 F (36.6 C)     Temp Source 11/09/18 0959 Oral     SpO2 11/09/18 0959 98 %     Weight 11/09/18 0955 298 lb (135.2 kg)     Height 11/09/18 0955 5\' 1"  (1.549 m)     Head Circumference --      Peak Flow --      Pain Score 11/09/18 0954 5     Pain Loc --      Pain Edu? --      Excl. in GC? --    No data found.  Updated Vital Signs BP (!) 150/99 (BP Location: Left Arm)   Pulse 82   Temp 97.9 F (36.6 C) (Oral)   Resp 16   Ht 5\' 1"  (1.549 m)   Wt 298 lb (135.2 kg)   LMP 11/09/2018 (Exact Date)  SpO2 98%   BMI 56.31 kg/m   Visual Acuity Right Eye Distance:   Left Eye Distance:   Bilateral Distance:    Right Eye Near:   Left Eye Near:    Bilateral Near:     Physical Exam Vitals signs and nursing note reviewed.  Constitutional:      General: She is not in acute distress.    Appearance: She is well-developed. She is obese. She is not ill-appearing, toxic-appearing or diaphoretic.  HENT:     Head: Normocephalic and atraumatic.     Mouth/Throat:     Mouth: Mucous membranes are moist.  Neck:     Musculoskeletal: Normal range of motion and neck supple.  Pulmonary:     Effort: Pulmonary effort is normal.     Breath sounds: Normal breath sounds.  Musculoskeletal: Normal range of motion.  Lymphadenopathy:     Cervical: No cervical adenopathy.  Skin:    General: Skin is warm and dry.  Neurological:     Mental Status: She is alert.     GCS: GCS eye subscore is 4. GCS verbal subscore is 5. GCS motor subscore is 6.  Psychiatric:        Mood and Affect: Mood normal.         Behavior: Behavior normal.      UC Treatments / Results  Labs (all labs ordered are listed, but only abnormal results are displayed) Labs Reviewed - No data to display  EKG None  Radiology No results found.  Procedures Procedures (including critical care time)  Medications Ordered in UC Medications - No data to display  Initial Impression / Assessment and Plan / UC Course  I have reviewed the triage vital signs and the nursing notes.  Pertinent labs & imaging results that were available during my care of the patient were reviewed by me and considered in my medical decision making (see chart for details).   Patient this is likely a viral illness does not require antibiotics.  Place her on Mucinex D and Flonase.  Also gave her cough suppressants with Tessalon Perles and Tussionex for nighttime use.  We will keep her out of work until Monday when she should be able to return to work.  Encouraged her increase of fluids.  Use Tylenol or Motrin as necessary for fever.   Final Clinical Impressions(s) / UC Diagnoses   Final diagnoses:  Upper respiratory tract infection, unspecified type   Discharge Instructions   None    ED Prescriptions    Medication Sig Dispense Auth. Provider   fluticasone (FLONASE) 50 MCG/ACT nasal spray Place 2 sprays into both nostrils daily. 16 g Ovid Curdoemer, Caylan Chenard P, PA-C   benzonatate (TESSALON) 200 MG capsule Take one cap TID PRN cough 30 capsule Lutricia Feiloemer, Charrise Lardner P, PA-C   chlorpheniramine-HYDROcodone (TUSSIONEX PENNKINETIC ER) 10-8 MG/5ML SUER Take 5 mLs by mouth 2 (two) times daily. 115 mL Ovid Curdoemer, Yuleidy Rappleye P, PA-C   pseudoephedrine-guaifenesin (MUCINEX D) 60-600 MG 12 hr tablet Take 1 tablet by mouth every 12 (twelve) hours. 14 tablet Lutricia Feiloemer, Hesham Womac P, PA-C     Controlled Substance Prescriptions [place] Controlled Substance Registry consulted? Not Applicable   Lutricia FeilRoemer, Eliyana Pagliaro P, PA-C 11/09/18 1135

## 2018-11-12 ENCOUNTER — Encounter: Payer: Self-pay | Admitting: Internal Medicine

## 2018-12-04 ENCOUNTER — Ambulatory Visit
Admission: EM | Admit: 2018-12-04 | Discharge: 2018-12-04 | Disposition: A | Discharge status: Home / Self Care | Payer: BLUE CROSS/BLUE SHIELD | Attending: Family Medicine | Admitting: Family Medicine

## 2018-12-04 DIAGNOSIS — R0981 Nasal congestion: Secondary | ICD-10-CM

## 2018-12-04 DIAGNOSIS — J029 Acute pharyngitis, unspecified: Secondary | ICD-10-CM | POA: Diagnosis not present

## 2018-12-04 LAB — RAPID STREP SCREEN (MED CTR MEBANE ONLY): Streptococcus, Group A Screen (Direct): NEGATIVE

## 2018-12-04 MED ORDER — AMOXICILLIN 500 MG PO CAPS: 500.0000 mg | ORAL_CAPSULE | Freq: Two times a day (BID) | ORAL | 0 refills | Status: DC

## 2018-12-04 MED ORDER — LIDOCAINE VISCOUS HCL 2 % MT SOLN: 15.0000 mL | OROMUCOSAL | 0 refills | Status: DC | PRN

## 2018-12-04 MED ORDER — PREDNISONE 20 MG PO TABS: ORAL_TABLET | ORAL | 0 refills | Status: DC

## 2018-12-04 NOTE — ED Triage Notes (Signed)
Pt states after her URI her tonsil swelling never went away and states it's getting hard for her to breathe and lay flat due to the swelling. 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

## 2018-12-04 NOTE — ED Provider Notes (Signed)
[facility]    CSN: [id number] Arrival date & time: 12/04/18  1825     History   Chief Complaint Chief Complaint  Patient presents with  . Sore Throat    HPI Theresa Pennerlissa Fordham is a 23 y.o. female.   HPI  -year-old female presents with severe sore throat URI she was seen for on 11/09/2018.  States that her tonsil swelling never seem to decrease and she states it is getting hard for her to breathe and lie flat due to the swelling.  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  Has had no fever or chills.  He is afebrile today.         Past Medical History:  Diagnosis Date  . Frequent headaches     Patient Active Problem List   Diagnosis Date Noted  . Grief reaction 11/06/2016  . Generalized anxiety disorder 09/27/2016  . Snoring 09/27/2016  . Menstrual irregularity 07/10/2016  . Obesity 07/10/2016  . Cervical cancer screening 07/10/2016  . Encounter for immunization 07/10/2016  . Encounter for preconception consultation 07/10/2016    Past Surgical History:  Procedure Laterality Date  . BREAST REDUCTION SURGERY  12/2013  . BREAST SURGERY    . WISDOM TOOTH EXTRACTION      OB History   No obstetric history on file.      Home Medications    Prior to Admission medications   Medication Sig Start Date End Date Taking? Authorizing Provider  ALPRAZolam (XANAX) 0.25 MG tablet Take 1 tablet (0.25 mg total) by mouth 2 (two) times daily as needed for anxiety. 11/04/16   Sherlene Shamsullo, Teresa L, MD  amoxicillin (AMOXIL) 500 MG capsule Take 1 capsule (500 mg total) by mouth 2 (two) times daily. 12/04/18   Lutricia Feiloemer, Tenise Stetler P, PA-C  benzonatate (TESSALON) 200 MG capsule Take one cap TID PRN cough 11/09/18   Lutricia Feiloemer, Catie Chiao P, PA-C  chlorpheniramine-HYDROcodone [facility](TUSSIONEX PENNKINETIC ER) 10-8 MG/5ML SUER Take 5 mLs by mouth 2 (two) times daily. 11/09/18   Lutricia Feiloemer, Nora Sabey P, PA-C  fluticasone (FLONASE) 50 MCG/ACT nasal spray Place 2 sprays into both  nostrils daily. 11/09/18   Lutricia Feiloemer, Lular Letson P, PA-C  lidocaine (XYLOCAINE) 2 % solution Use as directed 15 mLs in the mouth or throat as needed for mouth pain. Swish and spit. 12/04/18   Lutricia Feiloemer, Adraine Biffle P, PA-C  predniSONE (DELTASONE) 20 MG tablet Take 2 tablets (40 mg) daily by mouth 12/04/18   Lutricia Feiloemer, Deklen Popelka P, PA-C  pseudoephedrine-guaifenesin (MUCINEX D) 60-600 MG 12 hr tablet Take 1 tablet by mouth every 12 (twelve) hours. 11/09/18   Lutricia Feiloemer, Airen Dales P, PA-C  sertraline (ZOLOFT) 50 MG tablet Take 1 tablet (50 mg total) by mouth daily. 11/04/16   Sherlene Shamsullo, Teresa L, MD    Family History Family History  Problem Relation Age of Onset  . Depression Mother   . Anxiety disorder Mother   . Thyroid disease Mother   . Diabetes Paternal Grandmother   . COPD Paternal Grandfather     Social History Social History   Tobacco Use  . Smoking status: Never Smoker  . Smokeless tobacco: Never Used  Substance Use Topics  . Alcohol use: Yes    Comment: Social  . Drug use: Never     Allergies   Patient has no known allergies.   Review of Systems Review of Systems  Constitutional: Positive for activity change and appetite change. Negative for chills, diaphoresis, fatigue and fever.  HENT:  Positive for sore throat and voice change.   Respiratory: Positive for cough.   All other systems reviewed and are negative.    Physical Exam Triage Vital Signs ED Triage Vitals  Enc Vitals Group     BP 12/04/18 1842 (!) 136/103     Pulse Rate 12/04/18 1840 86     Resp 12/04/18 1840 18     Temp 12/04/18 1840 98.8 F (37.1 C)     Temp Source 12/04/18 1840 Oral     SpO2 12/04/18 1840 98 %     Weight 12/04/18 1843 298 lb (135.2 kg)     Height 12/04/18 1843 5\' 2"  (1.575 m)     Head Circumference --      Peak Flow --      Pain Score 12/04/18 1843 6     Pain Loc --      Pain Edu? --      Excl. in GC? --    No data found.  Updated Vital Signs BP (!) 136/103   Pulse 86   Temp 98.8 F (37.1 C) (Oral)    Resp 18   Ht 5\' 2"  (1.575 m)   Wt 298 lb (135.2 kg)   LMP 11/09/2018 (Exact Date)   SpO2 98%   BMI 54.50 kg/m   Visual Acuity Right Eye Distance:   Left Eye Distance:   Bilateral Distance:    Right Eye Near:   Left Eye Near:    Bilateral Near:     Physical Exam Vitals signs and nursing note reviewed.  Constitutional:      General: She is not in acute distress.    Appearance: She is well-developed. She is not ill-appearing, toxic-appearing or diaphoretic.  HENT:     Head: Normocephalic.     Comments: Both ears are occluded with cerumen    Nose: Congestion present. No rhinorrhea.     Mouth/Throat:     Mouth: Mucous membranes are moist. No oral lesions.     Pharynx: No pharyngeal swelling, oropharyngeal exudate, posterior oropharyngeal erythema or uvula swelling.     Tonsils: No tonsillar exudate or tonsillar abscesses. Swelling: 3+ on the right. 3+ on the left.     Comments: Uvula is midline.  Tonsils are enlarged but no sign of abscess is present. Eyes:     Conjunctiva/sclera: Conjunctivae normal.  Neck:     Musculoskeletal: Normal range of motion and neck supple.  Pulmonary:     Effort: Pulmonary effort is normal.     Breath sounds: Normal breath sounds.  Lymphadenopathy:     Cervical: No cervical adenopathy.  Skin:    General: Skin is warm and dry.  Neurological:     General: No focal deficit present.     Mental Status: She is alert and oriented to person, place, and time.  Psychiatric:        Mood and Affect: Mood normal.        Behavior: Behavior normal.      UC Treatments / Results  Labs (all labs ordered are listed, but only abnormal results are displayed) Labs Reviewed  RAPID STREP SCREEN ([facility] ONLY)  CULTURE, GROUP A STREP [facility])    EKG None  Radiology No results found.  Procedures Procedures (including critical care time)  Medications Ordered in UC Medications - No data to display  Initial Impression / Assessment and Plan /  UC Course  I have reviewed the triage vital signs and the nursing notes.  Pertinent labs &  imaging results that were available during my care of the patient were reviewed by me and considered in my medical decision making (see chart for details).   Patient appears miserable with her sore throat.  Even though she has a negative strep test today I will empirically treat her with amoxicillin awaiting the results of the cultures and sensitivities.  In addition I will place her on prednisone.  Have told her to use ibuprofen every 6 hours.  Provide her with lidocaine oral solution that she can swish and spit if the pain is more severe.  Out of work tomorrow and probably allow her to go to work the following day.  If she worsens she could go to the emergency room or return to our clinic   Final Clinical Impressions(s) / UC Diagnoses   Final diagnoses:  Sore throat   Discharge Instructions   None    ED Prescriptions    Medication Sig Dispense Auth. Provider   predniSONE (DELTASONE) 20 MG tablet Take 2 tablets (40 mg) daily by mouth 8 tablet Ovid Curd P, PA-C   amoxicillin (AMOXIL) 500 MG capsule Take 1 capsule (500 mg total) by mouth 2 (two) times daily. 20 capsule Ovid Curd P, PA-C   lidocaine (XYLOCAINE) 2 % solution Use as directed 15 mLs in the mouth or throat as needed for mouth pain. Swish and spit. 100 mL Lutricia Feil, PA-C     Controlled Substance Prescriptions [place] Controlled Substance Registry consulted? Not Applicable   Lutricia Feil, PA-C 12/04/18 1949

## 2018-12-07 LAB — CULTURE, GROUP A STREP (THRC)

## 2019-01-08 ENCOUNTER — Encounter
Admission: RE | Admit: 2019-01-08 | Discharge: 2019-01-08 | Disposition: A | Discharge status: Home / Self Care | Payer: BLUE CROSS/BLUE SHIELD | Source: Ambulatory Visit | Attending: Otolaryngology | Admitting: Otolaryngology

## 2019-01-08 ENCOUNTER — Other Ambulatory Visit: Payer: Self-pay

## 2019-01-08 HISTORY — DX: Major depressive disorder, single episode, unspecified: F32.9

## 2019-01-08 HISTORY — DX: Anxiety disorder, unspecified: F41.9

## 2019-01-08 HISTORY — DX: Depression, unspecified: F32.A

## 2019-01-08 NOTE — Patient Instructions (Signed)
Your procedure is scheduled on: 01-14-19 Report to Same Day Surgery 2nd floor medical mall [facility] Entrance-take elevator on left to 2nd floor.  Check in with surgery information desk.) To find out your arrival time please call [phone] between 1PM - 3PM on 01-11-19  Remember: Instructions that are not followed completely may result in serious medical risk, up to and including death, or upon the discretion of your surgeon and anesthesiologist your surgery may need to be rescheduled.    _x___ 1. Do not eat food after midnight the night before your procedure. You may drink clear liquids up to 2 hours before you are scheduled to arrive at the hospital for your procedure.  Do not drink clear liquids within 2 hours of your scheduled arrival to the hospital.  Clear liquids include  --Water or Apple juice without pulp  --Clear carbohydrate beverage such as ClearFast or Gatorade  --Black Coffee or Clear Tea (No milk, no creamers, do not add anything to the coffee or Tea   ____Ensure clear carbohydrate drink on the way to the hospital for bariatric patients  ____Ensure clear carbohydrate drink 3 hours before surgery for Dr Rutherford Nail patients if physician instructed.   No gum chewing or hard candies.     __x__ 2. No Alcohol for 24 hours before or after surgery.   __x__3. No Smoking or e-cigarettes for 24 prior to surgery.  Do not use any chewable tobacco products for at least 6 hour prior to surgery   ____  4. Bring all medications with you on the day of surgery if instructed.    __x__ 5. Notify your doctor if there is any change in your medical condition     (cold, fever, infections).    x___6. On the morning of surgery brush your teeth with toothpaste and water.  You may rinse your mouth with mouth wash if you wish.  Do not swallow any toothpaste or mouthwash.   Do not wear jewelry, make-up, hairpins, clips or nail polish.  Do not wear lotions, powders, or perfumes. You may wear  deodorant.  Do not shave 48 hours prior to surgery. Men may shave face and neck.  Do not bring valuables to the hospital.    [facility] is not responsible for any belongings or valuables.               Contacts, dentures or bridgework may not be worn into surgery.  Leave your suitcase in the car. After surgery it may be brought to your room.  For patients admitted to the hospital, discharge time is determined by your treatment team.  _  Patients discharged the day of surgery will not be allowed to drive home.  You will need someone to drive you home and stay with you the night of your procedure.    Please read over the following fact sheets that you were given:   [facility] Preparing for Surgery   ____ Take anti-hypertensive listed below, cardiac, seizure, asthma, anti-reflux and psychiatric medicines. These include:  1. NONE  2.  3.  4.  5.  6.  ____Fleets enema or Magnesium Citrate as directed.   ____ Use CHG Soap or sage wipes as directed on instruction sheet   ____ Use inhalers on the day of surgery and bring to hospital day of surgery  ____ Stop Metformin and Janumet 2 days prior to surgery.    ____ Take 1/2 of usual insulin dose the night before surgery and none on  the morning surgery.   ____ Follow recommendations from Cardiologist, Pulmonologist or PCP regarding stopping Aspirin, Coumadin, Plavix ,Eliquis, Effient, or Pradaxa, and Pletal.  X____Stop Anti-inflammatories such as Advil, Aleve, Ibuprofen, Motrin, Naproxen, Naprosyn, Goodies powders or aspirin products NOW-OK to take Tylenol   ____ Stop supplements until after surgery.    ____ Bring C-Pap to the hospital.

## 2019-01-14 ENCOUNTER — Ambulatory Visit: Payer: BLUE CROSS/BLUE SHIELD | Admitting: Anesthesiology

## 2019-01-14 ENCOUNTER — Other Ambulatory Visit: Payer: Self-pay

## 2019-01-14 ENCOUNTER — Encounter
Admission: RE | Disposition: A | Discharge status: Home / Self Care | Payer: Self-pay | Source: Home / Self Care | Attending: Otolaryngology

## 2019-01-14 ENCOUNTER — Ambulatory Visit
Admission: RE | Admit: 2019-01-14 | Discharge: 2019-01-14 | Disposition: A | Discharge status: Home / Self Care | Payer: BLUE CROSS/BLUE SHIELD | Attending: Otolaryngology | Admitting: Otolaryngology

## 2019-01-14 DIAGNOSIS — J988 Other specified respiratory disorders: Secondary | ICD-10-CM | POA: Diagnosis not present

## 2019-01-14 DIAGNOSIS — Z6841 Body Mass Index (BMI) 40.0 and over, adult: Secondary | ICD-10-CM | POA: Diagnosis not present

## 2019-01-14 DIAGNOSIS — J3501 Chronic tonsillitis: Secondary | ICD-10-CM | POA: Diagnosis present

## 2019-01-14 DIAGNOSIS — J351 Hypertrophy of tonsils: Secondary | ICD-10-CM | POA: Insufficient documentation

## 2019-01-14 DIAGNOSIS — E669 Obesity, unspecified: Secondary | ICD-10-CM | POA: Insufficient documentation

## 2019-01-14 HISTORY — PX: TONSILLECTOMY: SHX5217

## 2019-01-14 LAB — POCT PREGNANCY, URINE: Preg Test, Ur: NEGATIVE

## 2019-01-14 SURGERY — TONSILLECTOMY
Anesthesia: General | Laterality: Bilateral

## 2019-01-14 MED ORDER — ONDANSETRON HCL 4 MG/2ML IJ SOLN: 4.0000 mg | Freq: Once | INTRAMUSCULAR | Status: DC | PRN

## 2019-01-14 MED ORDER — HYDROCODONE-ACETAMINOPHEN 7.5-325 MG/15ML PO SOLN
ORAL | Status: AC
  Filled 2019-01-14: qty 15

## 2019-01-14 MED ORDER — FENTANYL CITRATE (PF) 100 MCG/2ML IJ SOLN
25.0000 ug | INTRAMUSCULAR | Status: DC | PRN
  Administered 2019-01-14 (×4): 25 ug via INTRAVENOUS

## 2019-01-14 MED ORDER — HYDROCODONE-ACETAMINOPHEN 7.5-325 MG/15ML PO SOLN: 15.0000 mL | Freq: Four times a day (QID) | ORAL | 0 refills | Status: AC | PRN

## 2019-01-14 MED ORDER — SUGAMMADEX SODIUM 200 MG/2ML IV SOLN
INTRAVENOUS | Status: AC
  Filled 2019-01-14: qty 2

## 2019-01-14 MED ORDER — ROCURONIUM BROMIDE 50 MG/5ML IV SOLN
INTRAVENOUS | Status: AC
  Filled 2019-01-14: qty 1

## 2019-01-14 MED ORDER — FAMOTIDINE 20 MG PO TABS
ORAL_TABLET | ORAL | Status: AC
  Administered 2019-01-14: 20 mg via ORAL
  Filled 2019-01-14: qty 1

## 2019-01-14 MED ORDER — LIDOCAINE 2% (20 MG/ML) 5 ML SYRINGE
INTRAMUSCULAR | Status: DC | PRN
  Administered 2019-01-14: 100 mg via INTRAVENOUS

## 2019-01-14 MED ORDER — PROPOFOL 10 MG/ML IV BOLUS
INTRAVENOUS | Status: DC | PRN
  Administered 2019-01-14: 200 mg via INTRAVENOUS

## 2019-01-14 MED ORDER — DEXAMETHASONE SODIUM PHOSPHATE 10 MG/ML IJ SOLN
INTRAMUSCULAR | Status: AC
  Filled 2019-01-14: qty 1

## 2019-01-14 MED ORDER — FAMOTIDINE 20 MG PO TABS
20.0000 mg | ORAL_TABLET | Freq: Once | ORAL | Status: AC
  Administered 2019-01-14: 20 mg via ORAL

## 2019-01-14 MED ORDER — MIDAZOLAM HCL 2 MG/2ML IJ SOLN
INTRAMUSCULAR | Status: AC
  Filled 2019-01-14: qty 2

## 2019-01-14 MED ORDER — ONDANSETRON HCL 4 MG/2ML IJ SOLN
INTRAMUSCULAR | Status: DC | PRN
  Administered 2019-01-14: 4 mg via INTRAVENOUS

## 2019-01-14 MED ORDER — FENTANYL CITRATE (PF) 250 MCG/5ML IJ SOLN
INTRAMUSCULAR | Status: AC
  Filled 2019-01-14: qty 5

## 2019-01-14 MED ORDER — PROPOFOL 10 MG/ML IV BOLUS
INTRAVENOUS | Status: AC
  Filled 2019-01-14: qty 20

## 2019-01-14 MED ORDER — ROCURONIUM BROMIDE 100 MG/10ML IV SOLN
INTRAVENOUS | Status: DC | PRN
  Administered 2019-01-14: 5 mg via INTRAVENOUS
  Administered 2019-01-14: 15 mg via INTRAVENOUS

## 2019-01-14 MED ORDER — FENTANYL CITRATE (PF) 100 MCG/2ML IJ SOLN
INTRAMUSCULAR | Status: AC
  Administered 2019-01-14: 25 ug via INTRAVENOUS
  Filled 2019-01-14: qty 2

## 2019-01-14 MED ORDER — FENTANYL CITRATE (PF) 100 MCG/2ML IJ SOLN
INTRAMUSCULAR | Status: DC | PRN
  Administered 2019-01-14: 100 ug via INTRAVENOUS

## 2019-01-14 MED ORDER — SUGAMMADEX SODIUM 200 MG/2ML IV SOLN
INTRAVENOUS | Status: DC | PRN
  Administered 2019-01-14: 273 mg via INTRAVENOUS

## 2019-01-14 MED ORDER — DEXMEDETOMIDINE HCL 200 MCG/2ML IV SOLN
INTRAVENOUS | Status: DC | PRN
  Administered 2019-01-14: 20 ug via INTRAVENOUS

## 2019-01-14 MED ORDER — ONDANSETRON HCL 4 MG/2ML IJ SOLN
INTRAMUSCULAR | Status: AC
  Filled 2019-01-14: qty 2

## 2019-01-14 MED ORDER — HYDROCODONE-ACETAMINOPHEN 7.5-325 MG/15ML PO SOLN
10.0000 mL | Freq: Four times a day (QID) | ORAL | Status: DC | PRN
  Administered 2019-01-14: 15 mL via ORAL
  Filled 2019-01-14 (×2): qty 15

## 2019-01-14 MED ORDER — SUCCINYLCHOLINE CHLORIDE 20 MG/ML IJ SOLN
INTRAMUSCULAR | Status: AC
  Filled 2019-01-14: qty 1

## 2019-01-14 MED ORDER — SUCCINYLCHOLINE CHLORIDE 20 MG/ML IJ SOLN
INTRAMUSCULAR | Status: DC | PRN
  Administered 2019-01-14: 100 mg via INTRAVENOUS

## 2019-01-14 MED ORDER — LIDOCAINE HCL (PF) 2 % IJ SOLN
INTRAMUSCULAR | Status: AC
  Filled 2019-01-14: qty 10

## 2019-01-14 MED ORDER — LACTATED RINGERS IV SOLN
INTRAVENOUS | Status: DC
  Administered 2019-01-14: 06:00:00 via INTRAVENOUS

## 2019-01-14 MED ORDER — DEXAMETHASONE SODIUM PHOSPHATE 10 MG/ML IJ SOLN
INTRAMUSCULAR | Status: DC | PRN
  Administered 2019-01-14: 10 mg via INTRAVENOUS

## 2019-01-14 SURGICAL SUPPLY — 15 items
BASIN GRAD PLASTIC 32OZ STRL (MISCELLANEOUS) ×2 IMPLANT
BLADE BOVIE TIP EXT 4 (BLADE) ×2 IMPLANT
CANISTER SUCT 1200ML W/VALVE (MISCELLANEOUS) ×2 IMPLANT
CATH ROBINSON RED A/P 10FR (CATHETERS) ×2 IMPLANT
COVER WAND RF STERILE (DRAPES) ×2 IMPLANT
ELECT REM PT RETURN 9FT ADLT (ELECTROSURGICAL) ×2
ELECTRODE REM PT RTRN 9FT ADLT (ELECTROSURGICAL) ×1 IMPLANT
GLOVE PROTEXIS LATEX SZ 7.5 (GLOVE) ×2 IMPLANT
GOWN STRL REUS W/ TWL LRG LVL3 (GOWN DISPOSABLE) ×2 IMPLANT
GOWN STRL REUS W/TWL LRG LVL3 (GOWN DISPOSABLE) ×2
PACK BASIC III (MISCELLANEOUS) ×1
PACK SRG BSC III STRL LF (MISCELLANEOUS) ×1 IMPLANT
PENCIL ELECTRO HAND CTR (MISCELLANEOUS) ×2 IMPLANT
SPONGE TONSIL TAPE 1 RFD (DISPOSABLE) ×2 IMPLANT
TUBING CONNECTING 10 (TUBING) ×2 IMPLANT

## 2019-01-14 NOTE — H&P (Signed)
H&P has been reviewed and patient reevaluated, no changes necessary. To be downloaded later.  

## 2019-01-14 NOTE — Transfer of Care (Signed)
Immediate Anesthesia Transfer of Care Note  Patient: Theresa [name]  Procedure(s) Performed: TONSILLECTOMY (Bilateral )  Patient Location: PACU  Anesthesia Type:General  Level of Consciousness: awake, alert  and oriented  Airway & Oxygen Therapy: Patient Spontanous Breathing and Patient connected to face mask oxygen  Post-op Assessment: Report given to RN and Post -op Vital signs reviewed and stable  Post vital signs: Reviewed and stable  Last Vitals:  Vitals Value Taken Time  BP    Temp    Pulse 80 01/14/2019  8:12 AM  Resp 14 01/14/2019  8:12 AM  SpO2 100 % 01/14/2019  8:12 AM  Vitals shown include unvalidated device data.  Last Pain:  Vitals:   01/14/19 0602  TempSrc: Tympanic  PainSc: 0-No pain         Complications: No apparent anesthesia complications

## 2019-01-14 NOTE — Anesthesia Postprocedure Evaluation (Signed)
Anesthesia Post Note  Patient: Theresa [name]  Procedure(s) Performed: TONSILLECTOMY (Bilateral )  Patient location during evaluation: PACU Anesthesia Type: General Level of consciousness: awake and alert and oriented Pain management: pain level controlled Vital Signs Assessment: post-procedure vital signs reviewed and stable Respiratory status: spontaneous breathing, nonlabored ventilation and respiratory function stable Cardiovascular status: blood pressure returned to baseline and stable Postop Assessment: no signs of nausea or vomiting Anesthetic complications: no     Last Vitals:  Vitals:   01/14/19 0930 01/14/19 1005  BP: 125/79 121/71  Pulse: 78 75  Resp: 16 16  Temp: 36.4 C   SpO2: 94% 95%    Last Pain:  Vitals:   01/14/19 1005  TempSrc:   PainSc: 2                  Anevay Campanella

## 2019-01-14 NOTE — Anesthesia Procedure Notes (Signed)
Procedure Name: Intubation Date/Time: 01/14/2019 7:46 AM Performed by: Paulette Blanch, CRNA Pre-anesthesia Checklist: Patient identified, Patient being monitored, Timeout performed, Emergency Drugs available and Suction available Patient Re-evaluated:Patient Re-evaluated prior to induction Oxygen Delivery Method: Circle system utilized Preoxygenation: Pre-oxygenation with 100% oxygen Induction Type: IV induction Ventilation: Mask ventilation without difficulty Laryngoscope Size: 3 and Miller Grade View: Grade I Tube type: Oral Tube size: 7.5 mm Number of attempts: 1 Placement Confirmation: ETT inserted through vocal cords under direct vision,  positive ETCO2 and breath sounds checked- equal and bilateral Secured at: 21 cm Tube secured with: Tape Dental Injury: Teeth and Oropharynx as per pre-operative assessment

## 2019-01-14 NOTE — Anesthesia Post-op Follow-up Note (Signed)
Anesthesia QCDR form completed.        

## 2019-01-14 NOTE — Discharge Instructions (Signed)
Tonsillectomy, Adult, Care After This sheet gives you information about how to care for yourself after your procedure. Your doctor may also give you more specific instructions. If you have problems or questions, contact your doctor. Follow these instructions at home: Eating and drinking   Follow instructions from your doctor about eating and drinking.  For many days after surgery, choose foods that are soft and cold. Examples are: ? Gelatin. ? Sherbet. ? Ice cream. ? Frozen ice pops.  If you have an upset stomach (are nauseous), choose liquids that are cold and that you can see through (clear liquids). Examples are water and apple juice without pulp. You can try thick liquids and soft foods when you can eat without throwing up (vomiting) and without too much pain. Examples are: ? Creamed soups. ? Soft, warm cereals, such as oatmeal or hot wheat cereal. ? Milk. ? Mashed potatoes. ? Applesauce.  Drink enough fluid to keep your pee (urine) clear or pale yellow. Driving  Do not drive for 24 hours if you were given a medicine to help you relax (sedative).  Do not drive or use heavy machinery while taking prescription pain medicine or until your health care provider approves. General instructions  Rest.  Keep your head raised (elevated) when lying down.  Take medicines only as told by your doctor. These include over-the-counter medicines and prescription medicines.  Do not use mouthwashes until your doctor says it is okay.  Gargle only as told by your doctor.  Stay away from people who are sick. Contact a doctor if:  Your pain gets worse or medicines do not help.  You have a fever.  You have a rash.  You feel light-headed or you pass out (faint).  You cannot swallow even a little liquid or spit (saliva).  Your pee is very dark. Get help right away if:  You have trouble breathing.  You bleed bright red blood from your throat.  You throw up bright red  blood. Summary  Follow instructions from your doctor about what you cannot eat or drink. For many days after surgery, choose foods that are soft and cold.  Talk with your doctor about how to keep your pain under control. This can help you rest and swallow better.  Get help right away if you bleed bright red blood from your throat or you throw up bright red blood. This information is not intended to replace advice given to you by your health care provider. Make sure you discuss any questions you have with your health care provider. Document Released: 11/19/2010 Document Revised: 09/09/2016 Document Reviewed: 09/09/2016 Elsevier Interactive Patient Education  2019 [employer]  AMBULATORY SURGERY  DISCHARGE INSTRUCTIONS   1) The drugs that you were given will stay in your system until tomorrow so for the next 24 hours you should not:  A) Drive an automobile B) Make any legal decisions C) Drink any alcoholic beverage   2) You may resume regular meals tomorrow.  Today it is better to start with liquids and gradually work up to solid foods.  You may eat anything you prefer, but it is better to start with liquids, then soup and crackers, and gradually work up to solid foods.   3) Please notify your doctor immediately if you have any unusual bleeding, trouble breathing, redness and pain at the surgery site, drainage, fever, or pain not relieved by medication.    4) Additional Instructions:   Please contact your physician with any problems or Same  Day Surgery at [phone], Monday through Friday 6 am to 4 pm, or [facility] at [facility] number at [phone].

## 2019-01-14 NOTE — Anesthesia Preprocedure Evaluation (Signed)
Anesthesia Evaluation  Patient identified by MRN, date of birth, ID band Patient awake    Reviewed: Allergy & Precautions, NPO status , Patient's Chart, lab work & pertinent test results  History of Anesthesia Complications Negative for: history of anesthetic complications  Airway Mallampati: II  TM Distance: >3 FB Neck ROM: Full    Dental no notable dental hx.    Pulmonary neg pulmonary ROS, neg sleep apnea, neg COPD,    breath sounds clear to auscultation- rhonchi (-) wheezing      Cardiovascular Exercise Tolerance: Good (-) hypertension(-) CAD and (-) Past MI  Rhythm:Regular Rate:Normal - Systolic murmurs and - Diastolic murmurs    Neuro/Psych  Headaches, PSYCHIATRIC DISORDERS Anxiety Depression    GI/Hepatic negative GI ROS, Neg liver ROS,   Endo/Other  negative endocrine ROSneg diabetes  Renal/GU negative Renal ROS     Musculoskeletal negative musculoskeletal ROS (+)   Abdominal (+) + obese,   Peds  Hematology negative hematology ROS (+)   Anesthesia Other Findings Past Medical History: No date: Anxiety     Comment:  h/o no meds No date: Depression     Comment:  h/o no meds No date: Frequent headaches     Comment:  frequent bad ha'sdue to sinus issues   Reproductive/Obstetrics                             Anesthesia Physical Anesthesia Plan  ASA: II  Anesthesia Plan: General   Post-op Pain Management:    Induction: Intravenous  PONV Risk Score and Plan: 2 and Ondansetron, Dexamethasone and Midazolam  Airway Management Planned: Oral ETT  Additional Equipment:   Intra-op Plan:   Post-operative Plan: Extubation in OR  Informed Consent: I have reviewed the patients History and Physical, chart, labs and discussed the procedure including the risks, benefits and alternatives for the proposed anesthesia with the patient or authorized representative who has indicated his/her  understanding and acceptance.     Dental advisory given  Plan Discussed with: CRNA and Anesthesiologist  Anesthesia Plan Comments:         Anesthesia Quick Evaluation

## 2019-01-14 NOTE — Op Note (Signed)
01/14/2019  8:03 AM    Theresa [name]  [id number]   Pre-Op Dx: Chronic tonsillitis, tonsil hypertrophy causing airway obstruction  Post-op Dx: Same  Proc: Tonsillectomy  Surg:  Beverly Sessions [name]  Anes:  GOT  EBL: 50 mL  Comp: None  Findings: Very large cryptic tonsils on both sides.  No sign of acute infection.  The stomach was suctioned and got out couple 100 mL of clear fluid.  Procedure: The patient was brought to the operating room and placed in supine position.  She was given general anesthesia by oral endotracheal intubation.  Once patient was asleep a Vernelle Emerald was used to visualize the oropharynx.  The tonsils were very large and protruding out towards the midline.  The soft palate retracted to visualize the adenoids and these were not enlarged.  There is a reasonably open nasopharynx.  The left tonsil was grasped and pulled medially.  The anterior pillar was incised using electrocautery.  The tonsil was dissected from its fossa using blunt dissection and electrocautery.  There is a lot of scar tissue to the muscle bed.  The tonsil was completely removed and bleeding was controlled with direct pressure and electrocautery.  This procedure was repeated on the right tonsil in a similar fashion.  The patient tolerated the procedure well.  A NG tube was placed down into the stomach and there is lot of clear liquids there.  About 200 mL of clear fluid was suctioned out.  She was awakened and taken to the recovery room in satisfactory condition.  There were no operative complications.  Dispo:   To PACU to be discharged home  Plan: To follow-up in the office in 2 to 3 weeks to make sure she is healing well.  She is given Tylenol with hydrocodone elixir to use for severe pain which she can supplement with Tylenol.  Beverly Sessions [name]  01/14/2019 8:03 AM

## 2019-01-15 LAB — SURGICAL PATHOLOGY

## 2020-12-14 ENCOUNTER — Other Ambulatory Visit: Payer: Self-pay

## 2020-12-14 ENCOUNTER — Ambulatory Visit (LOCAL_COMMUNITY_HEALTH_CENTER): Payer: Medicaid Other

## 2020-12-14 VITALS — BP 135/73 | Ht 62.0 in | Wt 194.0 lb

## 2020-12-14 DIAGNOSIS — Z3201 Encounter for pregnancy test, result positive: Secondary | ICD-10-CM

## 2020-12-14 LAB — PREGNANCY, URINE: Preg Test, Ur: POSITIVE — AB

## 2020-12-14 MED ORDER — PRENATAL 27-0.8 MG PO TABS: 1.0000 | ORAL_TABLET | Freq: Every day | ORAL | 0 refills | Status: AC

## 2020-12-14 NOTE — Progress Notes (Signed)
UPT positive. Plans prenatal care at [facility] and encouraged to establish care soon.  Denies taking any medication. Has initiated medicaid process. Jerel Shepherd, RN

## 2021-10-31 HISTORY — PX: TUBAL LIGATION: SHX77

## 2022-02-22 ENCOUNTER — Encounter: Payer: Self-pay | Admitting: Urology

## 2022-02-22 ENCOUNTER — Ambulatory Visit (INDEPENDENT_AMBULATORY_CARE_PROVIDER_SITE_OTHER): Payer: Medicaid Other | Admitting: Urology

## 2022-02-22 VITALS — BP 113/74 | HR 67 | Ht 62.0 in | Wt 170.0 lb

## 2022-02-22 DIAGNOSIS — N12 Tubulo-interstitial nephritis, not specified as acute or chronic: Secondary | ICD-10-CM

## 2022-02-22 DIAGNOSIS — R109 Unspecified abdominal pain: Secondary | ICD-10-CM

## 2022-02-22 DIAGNOSIS — N390 Urinary tract infection, site not specified: Secondary | ICD-10-CM

## 2022-02-22 MED ORDER — CEPHALEXIN 250 MG PO CAPS: 250.0000 mg | ORAL_CAPSULE | Freq: Every day | ORAL | 0 refills | Status: DC

## 2022-02-22 NOTE — Patient Instructions (Signed)
Take cranberry tablets twice daily over-the-counter, as well as the low-dose antibiotic for 60 days to help prevent recurrent infections.  If you have persistent high fever over 102 degrees despite the 2 weeks of antibiotics, call the clinic to consider CT to look for any abscess of the kidney ? ? ?Pyelonephritis, Adult ?Pyelonephritis is an infection that occurs in the kidney. The kidneys are the organs that filter a person's blood and move waste out of the bloodstream and into the urine. Urine passes from the kidneys, through tubes called ureters, and into the bladder. There are two main types of pyelonephritis: ?Infections that come on quickly without any warning (acute pyelonephritis). ?Infections that last for a long period of time (chronic pyelonephritis). ?In most cases, the infection clears up with treatment and does not cause further problems. More severe infections or chronic infections can sometimes spread to the bloodstream or lead to other problems with the kidneys. ?What are the causes? ?This condition is usually caused by: ?Bacteria traveling from the bladder up to the kidney. This may occur after having a bladder infection (cystitis) or urinary tract infection (UTI). ?Bladder infections caused from bacteria traveling from the bloodstream to the kidney. ?What increases the risk? ?This condition is more likely to develop in: ?Pregnant women. ?Older people. ?People who have any of these conditions: ?Diabetes. ?Inflammation of the prostate gland (prostatitis), in males. ?Kidney stones or bladder stones. ?Other abnormalities of the kidney or ureter. ?Cancer. ?People who have a catheter placed in the bladder. ?People who are sexually active. ?Women who use spermicides. ?People who have had a prior UTI. ?What are the signs or symptoms? ?Symptoms of this condition include: ?Frequent urination. ?Strong or persistent urge to urinate. ?Burning or stinging when urinating. ?Abdominal pain. ?Back pain. ?Pain in  the side or flank area. ?Fever or chills. ?Blood in the urine, or dark urine. ?Nausea or vomiting. ?How is this diagnosed? ?This condition may be diagnosed based on: ?Your medical history and a physical exam. ?Urine tests. ?Blood tests. ?You may also have imaging tests of the kidneys, such as an ultrasound or CT scan. ?How is this treated? ?Treatment for this condition may depend on the severity of the infection. ?If the infection is mild and is found early, you may be treated with antibiotic medicines taken by mouth (orally). You will need to drink fluids to remain hydrated. ?If the infection is more severe, you may need to stay in the hospital and receive antibiotics given directly into a vein through an IV. You may also need to receive fluids through an IV if you are not able to remain hydrated. After your hospital stay, you may need to take oral antibiotics for a period of time. ?Other treatments may be required, depending on the cause of the infection. ?Follow these instructions at home: ?Medicines ?Take your antibiotic medicine as told by your health care provider. Do not stop taking the antibiotic even if you start to feel better. ?Take over-the-counter and prescription medicines only as told by your health care provider. ?General instructions ? ?Drink enough fluid to keep your urine pale yellow. ?Avoid caffeine, tea, and carbonated beverages. They tend to irritate the bladder. ?Urinate often. Avoid holding in urine for long periods of time. ?Urinate before and after sex. ?After a bowel movement, women should cleanse from front to back. Use each tissue only once. ?Keep all follow-up visits as told by your health care provider. This is important. ?Contact a health care provider if: ?Your symptoms do  not get better after 2 days of treatment. ?Your symptoms get worse. ?You have a fever. ?Get help right away if you: ?Are unable to take your antibiotics or fluids. ?Have shaking chills. ?Vomit. ?Have severe flank  or back pain. ?Have extreme weakness or fainting. ?Summary ?Pyelonephritis is a urinary tract infection (UTI) that occurs in the kidney. ?Treatment for this condition may depend on the severity of the infection. ?Take your antibiotic medicine as told by your health care provider. Do not stop taking the antibiotic even if you start to feel better. ?Drink enough fluid to keep your urine pale yellow. ?Keep all follow-up visits as told by your health care provider. This is important. ?This information is not intended to replace advice given to you by your health care provider. Make sure you discuss any questions you have with your health care provider. ?Document Revised: 05/27/2021 Document Reviewed: 05/27/2021 ?Elsevier Patient Education ? 2023 [employer] Inc[employer] ? ?

## 2022-02-22 NOTE — Progress Notes (Signed)
? ?02/22/22 ?10:37 AM  ? ?Theresa [name] ?1996-07-24 ?[id number] ? ?CC: Flank pain, pyelonephritis ? 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  She originally presented in early March 2023 with high fevers and malaise and back pain, and CT suggested possible left-sided pyelonephritis with no hydronephrosis or nephrolithiasis, and she was treated with antibiotics.  She had recurrence of symptoms as well as some right-sided flank pain last week and return to the [facility] ER.  CT at that time was benign, and she was treated with a 2-week course of Cipro which has improved her symptoms somewhat.  She denies any gross hematuria or UTI symptoms at this time.  She denies any history of kidney stones.  She has had multiple changes in hormones recently with pregnancy, Nexplanon, and OCPs for bleeding.  The original culture from March grew E. coli, but recent urine culture 02/17/2022 showed no growth. ? ?PMH: ?Past Medical History:  ?Diagnosis Date  ? Anxiety   ? h/o no meds  ? Depression   ? h/o no meds  ? Frequent headaches   ? frequent bad ha'sdue to sinus issues  ? ? ?Surgical History: ?Past Surgical History:  ?Procedure Laterality Date  ? BREAST REDUCTION SURGERY  12/2013  ? BREAST SURGERY    ? GASTROPLASTY DUODENAL SWITCH    ? 2020  ? TONSILLECTOMY Bilateral 01/14/2019  ? Procedure: TONSILLECTOMY;  Surgeon: Margaretha Sheffield, MD;  Location: [facility] ORS;  Service: ENT;  Laterality: Bilateral;  ? WISDOM TOOTH EXTRACTION    ? ? ? ?Family History: ?Family History  ?Problem Relation Age of Onset  ? Depression Mother   ? Anxiety disorder Mother   ? Thyroid disease Mother   ? Diabetes Paternal Grandmother   ? COPD Paternal Grandfather   ? ? ?Social History:  reports that she has never smoked. She has never used smokeless tobacco. She reports that she does not currently use alcohol. She reports that she does not use drugs. ? ?Physical Exam: ?BP 113/74  (BP Location: Left Arm, Patient Position: Sitting, Cuff Size: Large)   Pulse 67   Ht 5\' 2"  (1.575 m)   Wt 170 lb (77.1 kg)   BMI 31.09 kg/m?   ? ?Constitutional:  Alert and oriented, No acute distress. ?Cardiovascular: No clubbing, cyanosis, or edema. ?Respiratory: Normal respiratory effort, no increased work of breathing. ?GI: Abdomen is soft, nontender, nondistended, no abdominal masses ? ?Laboratory Data: ?Reviewed, see HPI ? ?Pertinent Imaging: ?I have personally viewed and interpreted both CT scans from [facility] dated 01/07/2022 and 02/18/2022.  Both were performed without contrast.  Initial CT showed some left perinephric stranding, but no hydronephrosis or stones, most recent CT is benign from a urology perspective. ? ?Assessment & Plan:   ?26 year old female with 2 possible episodes of pyelonephritis over the last 2 months of unclear etiology.  CT is reassuring with no hydronephrosis or stone disease.  We discussed possible etiologies of persistent symptoms or fevers including renal abscess that could warrant further evaluation with CT with contrast if recurrence of symptoms.  We focused primarily on UTI prevention, and I recommended cranberry tablets twice daily in addition to his 60-day course of low-dose Keflex.  Return precautions discussed at length, if she has persistent high fever would recommend a CT abdomen and pelvis with contrast to evaluate for renal abscess. ? ?Cranberry tablets twice daily, low-dose Keflex daily x60 days after completion of Cipro ?RTC 3  to 4 months symptom check ? ?Nickolas Madrid, MD ?02/22/2022 ? ?[facility] ?[address] ?[place], [place] [address] ?[phone] ? ? ?

## 2022-03-11 ENCOUNTER — Encounter (INDEPENDENT_AMBULATORY_CARE_PROVIDER_SITE_OTHER): Payer: Medicaid Other

## 2022-03-11 DIAGNOSIS — R110 Nausea: Secondary | ICD-10-CM | POA: Diagnosis not present

## 2022-03-14 ENCOUNTER — Other Ambulatory Visit: Payer: Self-pay | Admitting: Physician Assistant

## 2022-03-15 MED ORDER — ONDANSETRON 4 MG PO TBDP: 4.0000 mg | ORAL_TABLET | Freq: Three times a day (TID) | ORAL | 0 refills | Status: DC | PRN

## 2022-03-15 NOTE — Telephone Encounter (Signed)
Please see the MyChart message reply(ies) for my assessment and plan.  ?  ?This patient gave consent for this Medical Advice Message and is aware that it may result in a bill to [employer], as well as the possibility of receiving a bill for a co-payment or deductible. They are an established patient, but are not seeking medical advice exclusively about a problem treated during an in person or video visit in the last seven days. I did not recommend an in person or video visit within seven days of my reply.  ?  ?I spent a total of 8 minutes cumulative time within 7 days through [employer]. ? ?Carman Ching, PA-C   ?

## 2022-05-24 ENCOUNTER — Ambulatory Visit (INDEPENDENT_AMBULATORY_CARE_PROVIDER_SITE_OTHER): Payer: Medicaid Other | Admitting: Urology

## 2022-05-24 ENCOUNTER — Encounter: Payer: Self-pay | Admitting: Urology

## 2022-05-24 VITALS — BP 105/73 | HR 69 | Ht 62.0 in | Wt 180.0 lb

## 2022-05-24 DIAGNOSIS — Z87448 Personal history of other diseases of urinary system: Secondary | ICD-10-CM | POA: Diagnosis not present

## 2022-05-24 DIAGNOSIS — N390 Urinary tract infection, site not specified: Secondary | ICD-10-CM

## 2022-05-24 NOTE — Patient Instructions (Signed)
Continue cranberry tablets

## 2022-05-24 NOTE — Progress Notes (Signed)
   05/24/2022 10:02 AM   Theresa [name] 02-23-1996 [id number]  Reason for visit: Follow up recurrent pyelonephritis  HPI: 26 year old female with history of gastric bypass who was seen at [facility] twice in the spring 2023 for possible pyelonephritis.  CTs scan showed no evidence of hydronephrosis or nephrolithiasis, but possible left-sided pyelonephritis.  She has also had multiple changes in her hormones recently with pregnancy, changes in OCPs, and most recently laparoscopic removal of fallopian tubes.  At our last visit we had trialed cranberry tablet prophylaxis and 60 days of low-dose Keflex.  She has done very well since that time.  She denies any infections.  Her nausea has resolved and she feels much better.  She will continue the cranberry tablet prophylaxis.  Regarding her history of gastric bypass, we discussed stone prevention strategies including intake of normal amount of calcium, avoiding high oxalate foods, and increase fluid intake.  She prefers to follow-up as needed   Sondra Come, MD  [facility] [address] [place], [place] [address] [phone]

## 2023-01-26 ENCOUNTER — Ambulatory Visit (INDEPENDENT_AMBULATORY_CARE_PROVIDER_SITE_OTHER): Payer: Medicaid Other | Admitting: Plastic Surgery

## 2023-01-26 ENCOUNTER — Encounter: Payer: Self-pay | Admitting: Plastic Surgery

## 2023-01-26 ENCOUNTER — Encounter: Payer: Self-pay | Admitting: *Deleted

## 2023-01-26 VITALS — BP 120/85 | HR 66 | Ht 62.0 in | Wt 194.8 lb

## 2023-01-26 DIAGNOSIS — Z6835 Body mass index (BMI) 35.0-35.9, adult: Secondary | ICD-10-CM | POA: Diagnosis not present

## 2023-01-26 DIAGNOSIS — M793 Panniculitis, unspecified: Secondary | ICD-10-CM | POA: Diagnosis not present

## 2023-01-26 DIAGNOSIS — R21 Rash and other nonspecific skin eruption: Secondary | ICD-10-CM

## 2023-01-26 DIAGNOSIS — M549 Dorsalgia, unspecified: Secondary | ICD-10-CM | POA: Diagnosis not present

## 2023-01-26 NOTE — Progress Notes (Signed)
Patient ID: Theresa [name], female    DOB: [birth year], 27 y.o.   MRN: [id number]   Chief Complaint  Patient presents with   Consult    The patient is a 27 year old female here for evaluation of her abdomen.  She is 5 feet 2 inches tall and weighs 194 pounds with a MI of 35.6 kg/m.  Her original weight was 335 pounds.  She had the duodenal switch in 2020.  She is having to use a lot of creams and lotions to prevent skin breakdown and yeast infections.  She has had several surgeries in the past including breast reduction, laparoscopic oophorectomy and gastric duodenal switch.  She is not a smoker and does not have diabetes.  She has a good abdominal wall strength with no obvious diastases recti and no sign of a hernia on exam.    Review of Systems  Constitutional: Negative.   HENT: Negative.    Eyes: Negative.   Respiratory: Negative.  Negative for chest tightness and shortness of breath.   Cardiovascular: Negative.   Gastrointestinal: Negative.   Endocrine: Negative.   Genitourinary: Negative.   Musculoskeletal:  Positive for back pain.    Past Medical History:  Diagnosis Date   Anxiety    h/o no meds   Depression    h/o no meds   Frequent headaches    frequent bad ha'sdue to sinus issues    Past Surgical History:  Procedure Laterality Date   BREAST REDUCTION SURGERY  12/2013   BREAST SURGERY     GASTROPLASTY DUODENAL SWITCH     2020   TONSILLECTOMY Bilateral 01/14/2019   Procedure: TONSILLECTOMY;  Surgeon: Margaretha Sheffield, MD;  Location: [facility] ORS;  Service: ENT;  Laterality: Bilateral;   WISDOM TOOTH EXTRACTION        Current Outpatient Medications:    ALTAVERA 0.15-30 MG-MCG tablet, Take 1 tablet by mouth every morning., Disp: , Rfl:    escitalopram (LEXAPRO) 10 MG tablet, Take 10 mg by mouth every morning., Disp: , Rfl:    hydrOXYzine (ATARAX) 25 MG tablet, Take 25 mg by mouth 3 (three) times daily., Disp: , Rfl:    nystatin (MYCOSTATIN/NYSTOP) powder, Apply  topically., Disp: , Rfl:    Objective:   Vitals:   01/26/23 1057  BP: 120/85  Pulse: 66  SpO2: 99%    Physical Exam Vitals and nursing note reviewed.  Constitutional:      Appearance: Normal appearance.  HENT:     Head: Normocephalic and atraumatic.  Cardiovascular:     Rate and Rhythm: Normal rate.     Pulses: Normal pulses.  Pulmonary:     Effort: Pulmonary effort is normal.  Abdominal:     General: There is no distension.     Palpations: Abdomen is soft.     Tenderness: There is no abdominal tenderness.  Musculoskeletal:        General: No swelling or deformity.  Skin:    General: Skin is warm.     Capillary Refill: Capillary refill takes less than 2 seconds.     Coloration: Skin is not jaundiced.     Findings: No bruising.  Neurological:     Mental Status: She is alert and oriented to person, place, and time.  Psychiatric:        Mood and Affect: Mood normal.        Behavior: Behavior normal.        Thought Content: Thought content normal.  Judgment: Judgment normal.     Assessment & Plan:  Panniculitis  The patient is a candidate for panniculectomy.  She is going to consider adding on for the upper abdomen and lipo.  She suffers from ongoing skin irritation and rashes.  We talked about her high risk of complications because of her greater than 50 pound weight reduction.  Like to move ahead with submission to insurance.  I also encouraged her to work on the last 10 pounds of what she hopes to lose from her weight.  Pictures were obtained of the patient and placed in the chart with the patient's or guardian's permission.   [place], DO

## 2023-02-10 ENCOUNTER — Ambulatory Visit (INDEPENDENT_AMBULATORY_CARE_PROVIDER_SITE_OTHER): Payer: Medicaid Other

## 2023-02-10 ENCOUNTER — Encounter: Payer: Self-pay | Admitting: Emergency Medicine

## 2023-02-10 ENCOUNTER — Ambulatory Visit
Admission: EM | Admit: 2023-02-10 | Discharge: 2023-02-10 | Disposition: A | Discharge status: Home / Self Care | Payer: Medicaid Other | Attending: Physician Assistant | Admitting: Physician Assistant

## 2023-02-10 DIAGNOSIS — M79671 Pain in right foot: Secondary | ICD-10-CM | POA: Diagnosis not present

## 2023-02-10 MED ORDER — CYCLOBENZAPRINE HCL 10 MG PO TABS: 10.0000 mg | ORAL_TABLET | Freq: Every day | ORAL | 0 refills | Status: DC

## 2023-02-10 MED ORDER — PREDNISONE 20 MG PO TABS: 40.0000 mg | ORAL_TABLET | Freq: Every day | ORAL | 0 refills | Status: DC

## 2023-02-10 MED ORDER — DEXAMETHASONE SODIUM PHOSPHATE 10 MG/ML IJ SOLN
10.0000 mg | Freq: Once | INTRAMUSCULAR | Status: AC
  Administered 2023-02-10: 10 mg via INTRAMUSCULAR

## 2023-02-10 NOTE — Discharge Instructions (Signed)
X-rays negative for injury to the bone, based on examination symptoms are most likely caused by irritation to the tendon or ligament of the foot most likely due to increased activity  You have been given an injection of Decadron here today in the office which reduces inflammation and daily you will start to see improvement in about 30 minutes  Starting tomorrow take prednisone every morning with food for 5 days continue the process, may use Tylenol in addition to this as needed  May use muscle relaxant at bedtime for additional comfort, be mindful of this will make you drowsy  Continue ice and heat over the affected area in 10 to 15-minute intervals  Continue elevating when sitting and lying  Continue activity as tolerated  If your symptoms continue to persist past use of medicine you may follow-up with podiatry who are the specialist, information is listed on front page

## 2023-02-10 NOTE — ED Provider Notes (Signed)
[facility]    CSN: [id number] Arrival date & time: 02/10/23  1516      History   Chief Complaint Chief Complaint  Patient presents with   Foot Pain    right    HPI Theresa [name] is a 27 y.o. female.   Patient presents for evaluation of constant and right great toe pain beginning 7 days ago without injury or trauma.  Endorses that she did [place] the zoo which was increased walking, was wearing tennis shoes.  Pain described as a constant pressure, worsens in severity when bearing weight and with flexion of the great toe, causing a gritty sand feeling and tingling.  Has attempted use of ibuprofen, ice and elevation with no improvement.   Past Medical History:  Diagnosis Date   Anxiety    h/o no meds   Depression    h/o no meds   Frequent headaches    frequent bad ha'sdue to sinus issues    Patient Active Problem List   Diagnosis Date Noted   Panniculitis 01/26/2023   Grief reaction 11/06/2016   Generalized anxiety disorder 09/27/2016   Snoring 09/27/2016   Menstrual irregularity 07/10/2016   Obesity 07/10/2016   Cervical cancer screening 07/10/2016   Encounter for immunization 07/10/2016   Encounter for preconception consultation 07/10/2016    Past Surgical History:  Procedure Laterality Date   BREAST REDUCTION SURGERY  12/2013   BREAST SURGERY     GASTROPLASTY DUODENAL SWITCH     2020   TONSILLECTOMY Bilateral 01/14/2019   Procedure: TONSILLECTOMY;  Surgeon: Vernie Murders, MD;  Location: [facility] ORS;  Service: ENT;  Laterality: Bilateral;   WISDOM TOOTH EXTRACTION      OB History     Gravida  1   Para  0   Term  0   Preterm  0   AB  0   Living  0      SAB  0   IAB  0   Ectopic  0   Multiple  0   Live Births  0            Home Medications    Prior to Admission medications   Medication Sig Start Date End Date Taking? Authorizing Provider  ALTAVERA 0.15-30 MG-MCG tablet Take 1 tablet by mouth every morning. 03/10/22  Yes  [provider]  escitalopram (LEXAPRO) 10 MG tablet Take 10 mg by mouth every morning. 05/12/22  Yes [provider]  hydrOXYzine (ATARAX) 25 MG tablet Take 25 mg by mouth 3 (three) times daily. 05/17/22  Yes [provider]  nystatin (MYCOSTATIN/NYSTOP) powder Apply topically. 01/18/23 01/18/24  [provider]    Family History Family History  Problem Relation Age of Onset   Depression Mother    Anxiety disorder Mother    Thyroid disease Mother    Diabetes Paternal Grandmother    COPD Paternal Grandfather     Social History Social History   Tobacco Use   Smoking status: Never    Passive exposure: Never   Smokeless tobacco: Never  Vaping Use   Vaping Use: Never used  Substance Use Topics   Alcohol use: Not Currently    Comment: Social, no use since 08/2020   Drug use: Never     Allergies   Promethazine   Review of Systems Review of Systems   Physical Exam Triage Vital Signs ED Triage Vitals  Enc Vitals Group     BP 02/10/23 1529 115/75  Pulse Rate 02/10/23 1529 78     Resp 02/10/23 1529 14     Temp 02/10/23 1529 98.2 F (36.8 C)     Temp Source 02/10/23 1529 Oral     SpO2 02/10/23 1529 95 %     Weight 02/10/23 1525 194 lb 14.2 oz (88.4 kg)     Height 02/10/23 1525 5\' 2"  (1.575 m)     Head Circumference --      Peak Flow --      Pain Score 02/10/23 1525 9     Pain Loc --      Pain Edu? --      Excl. in GC? --    No data found.  Updated Vital Signs BP 115/75 (BP Location: Right Arm)   Pulse 78   Temp 98.2 F (36.8 C) (Oral)   Resp 14   Ht 5\' 2"  (1.575 m)   Wt 194 lb 14.2 oz (88.4 kg)   LMP 01/12/2023 (Approximate)   SpO2 95%   Breastfeeding No   BMI 35.65 kg/m   Visual Acuity Right Eye Distance:   Left Eye Distance:   Bilateral Distance:    Right Eye Near:   Left Eye Near:    Bilateral Near:     Physical Exam Constitutional:      Appearance: Normal appearance.  Eyes:     Extraocular Movements:  Extraocular movements intact.  Pulmonary:     Effort: Pulmonary effort is normal.  Musculoskeletal:     Comments: Tenderness is present to the dorsum and plantar aspect of the right midfoot, pain is present at the base of the first metatarsal without ecchymosis, swelling or deformity, able to bear weight but pain is elicited, able to complete range of motion but pain is elicited with flexion of the right great toe, sensation intact, capillary refill less than 3, 2+ pedal pulse  Skin:    General: Skin is warm and dry.  Neurological:     Mental Status: She is alert and oriented to person, place, and time. Mental status is at baseline.      UC Treatments / Results  Labs (all labs ordered are listed, but only abnormal results are displayed) Labs Reviewed - No data to display  EKG   Radiology DG Foot Complete Right  Result Date: 02/10/2023 CLINICAL DATA:  Right foot pain right big toe pain for a week. No known injury. There is a lot of locking at the 0 6 days ago. EXAM: RIGHT FOOT COMPLETE - 3+ VIEW COMPARISON:  None Available. FINDINGS: Normal bone mineralization. Joint spaces are preserved. Cortices are intact. No acute fracture or dislocation. IMPRESSION: Normal right foot radiographs. Electronically Signed   By: Neita Garnet M.D.   On: 02/10/2023 15:46    Procedures Procedures (including critical care time)  Medications Ordered in UC Medications - No data to display  Initial Impression / Assessment and Plan / UC Course  I have reviewed the triage vital signs and the nursing notes.  Pertinent labs & imaging results that were available during my care of the patient were reviewed by me and considered in my medical decision making (see chart for details).  Right foot pain  X-rays negative, etiology is most likely tendinitis, discussed with patient, Decadron injection given in office and prescribed prednisone and Flexeril for outpatient use, recommended continued use of RICE,  massage stretching, heat and activity as tolerated, advise follow-up with podiatry if symptoms persist past use of medication  Final Clinical Impressions(s) /  UC Diagnoses   Final diagnoses:  None   Discharge Instructions   None    ED Prescriptions   None    PDMP not reviewed this encounter.   Valinda Hoar, NP 02/10/23 1607

## 2023-02-10 NOTE — ED Triage Notes (Signed)
Patient c/o pain on the right foot and pain in her right big toe since last Saturday.  Patient denies any injury or fall.  Patient states that on Saturday she [place] the [place].

## 2023-02-24 ENCOUNTER — Telehealth: Payer: Self-pay | Admitting: *Deleted

## 2023-02-24 NOTE — Telephone Encounter (Signed)
Auth submitted for [id number], status pending

## 2023-03-09 ENCOUNTER — Encounter: Payer: Self-pay | Admitting: Plastic Surgery

## 2023-03-17 ENCOUNTER — Telehealth: Payer: Self-pay

## 2023-03-17 NOTE — Telephone Encounter (Signed)
Err, other note already open

## 2023-03-17 NOTE — Telephone Encounter (Signed)
Patient called back and said the Auth was approved for panniculectomy but since it was decided to add the upper abdomen as well she needed it extended until 06/25/23 so she can pay all the fees. She said Herbert Seta told her to call and see if Tresa Endo can call the insurance to change the auth date because right now it was until 03/25/23. Call back for patient is [phone].

## 2023-03-28 NOTE — Telephone Encounter (Signed)
Pt called to check on status of the extension of her insurance authorization. Please follow up with patient at [phone].

## 2023-04-10 ENCOUNTER — Telehealth: Payer: Self-pay

## 2023-04-10 NOTE — Telephone Encounter (Signed)
Ref# Erica R. 04/10/2023. Verified surgery was approved with authorization #[id number]. She will be faxing a copy of authorization.  Patient was notified

## 2023-04-12 ENCOUNTER — Telehealth: Payer: Self-pay | Admitting: *Deleted

## 2023-04-12 NOTE — Telephone Encounter (Signed)
Patient wants to have surgery in August '24 but her plan terms 7/31. Made patient aware. She is going to contact her medicaid plan to re-up her coverage then will let me know when it is fixed so I can submit a new auth for Aug surgery.

## 2023-05-03 ENCOUNTER — Telehealth: Payer: Self-pay | Admitting: *Deleted

## 2023-05-03 NOTE — Telephone Encounter (Signed)
New authorization submitted for CPT 15830  Authorization #: [id number] pending with [employer]

## 2023-05-17 NOTE — Telephone Encounter (Signed)
Patient called to follow up. Please call back and let her know an update

## 2023-05-17 NOTE — Telephone Encounter (Signed)
Patient called back and said she had called her insurance and they told her that it was approved and she wanted to let us know

## 2023-05-19 ENCOUNTER — Ambulatory Visit (INDEPENDENT_AMBULATORY_CARE_PROVIDER_SITE_OTHER): Payer: Medicaid Other | Admitting: Physician Assistant

## 2023-05-19 VITALS — BP 116/74 | HR 78 | Resp 18 | Ht 62.0 in

## 2023-05-19 DIAGNOSIS — M793 Panniculitis, unspecified: Secondary | ICD-10-CM

## 2023-05-19 MED ORDER — CEPHALEXIN 500 MG PO CAPS: 500.0000 mg | ORAL_CAPSULE | Freq: Four times a day (QID) | ORAL | 0 refills | Status: DC

## 2023-05-19 MED ORDER — ONDANSETRON HCL 4 MG PO TABS: 4.0000 mg | ORAL_TABLET | Freq: Three times a day (TID) | ORAL | 0 refills | Status: AC | PRN

## 2023-05-19 MED ORDER — OXYCODONE HCL 5 MG PO TABS: 5.0000 mg | ORAL_TABLET | Freq: Four times a day (QID) | ORAL | 0 refills | Status: DC | PRN

## 2023-05-19 NOTE — H&P (View-Only) (Signed)
[name] ID: Theresa [name], female    DOB: 1996/03/12, 27 y.o.   MRN: [id number]  Chief Complaint  [name] presents with   Pre-op Exam    No diagnosis found.   History of Present Illness: Theresa [name] is a 28 y.o.  female  with a history of panniculitis.  She presents for preoperative evaluation for upcoming procedure panniculectomy  with Dr. Ulice Bold on 05/31/2023.    The [name] has not had problems with anesthesia. She notes a history of breast reduction and gastric surgery with no issue or complications.  Summary of Previous Visit: Below is a recap of her previous office visit with Dr. Ulice Bold.  The [name] is a 27 year old female here for evaluation of her abdomen. She is 5 feet 2 inches tall and weighs 194 pounds with a MI of 35.6 kg/m. Her original weight was 335 pounds. She had the duodenal switch in 2020. She is having to use a lot of creams and lotions to prevent skin breakdown and yeast infections. She has had several surgeries in the past including breast reduction, laparoscopic oophorectomy and gastric duodenal switch. She is not a smoker and does not have diabetes. She has a good abdominal wall strength with no obvious diastases recti and no sign of a hernia on exam.   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   PMH Significant for: panniculitis, anxiety, depression   She notes that she is generally healthy. She denies any issues with her previous surgeries. No personal or family history of blood clots or coagulopathies; no other significant risk factors. She does not smoke or use nicotine products.    Past Medical History: Allergies: Allergies  Allergen Reactions   Promethazine Nausea And Vomiting    Current Medications:  Current Outpatient Medications:    ALTAVERA 0.15-30 MG-MCG tablet, Take 1 tablet by mouth every morning., Disp: , Rfl:    cyclobenzaprine (FLEXERIL) 10 MG tablet, Take 1 tablet (10 mg  total) by mouth at bedtime., Disp: 10 tablet, Rfl: 0   escitalopram (LEXAPRO) 10 MG tablet, Take 10 mg by mouth every morning., Disp: , Rfl:    hydrOXYzine (ATARAX) 25 MG tablet, Take 25 mg by mouth 3 (three) times daily., Disp: , Rfl:    nystatin (MYCOSTATIN/NYSTOP) powder, Apply topically., Disp: , Rfl:    predniSONE (DELTASONE) 20 MG tablet, Take 2 tablets (40 mg total) by mouth daily., Disp: 10 tablet, Rfl: 0  Past Medical Problems: Past Medical History:  Diagnosis Date   Anxiety    h/o no meds   Depression    h/o no meds   Frequent headaches    frequent bad ha'sdue to sinus issues    Past Surgical History: Past Surgical History:  Procedure Laterality Date   BREAST REDUCTION SURGERY  12/2013   BREAST SURGERY     GASTROPLASTY DUODENAL SWITCH     2020   TONSILLECTOMY Bilateral 01/14/2019   Procedure: TONSILLECTOMY;  Surgeon: Vernie Murders, MD;  Location: [facility] ORS;  Service: ENT;  Laterality: Bilateral;   WISDOM TOOTH EXTRACTION      Social History: Social History   Socioeconomic History   Marital status: Married    Spouse name: Not on file   Number of children: Not on file   Years of education: Not on file   Highest education level: Not on file  Occupational History   Occupation: Production designer, theatre/television/film     Comment: [employer] and [employer]  Tobacco Use   Smoking  status: Never    Passive exposure: Never   Smokeless tobacco: Never  Vaping Use   Vaping status: Never Used  Substance and Sexual Activity   Alcohol use: Not Currently    Comment: Social, no use since 08/2020   Drug use: Never   Sexual activity: Yes    Birth control/protection: Pill  Other Topics Concern   Not on file  Social History Narrative   ** Merged History Encounter **       Social Determinants of Health   Financial Resource Strain: Low Risk  (11/09/2021)   Received from [facility], [facility]   Overall Financial Resource Strain (CARDIA)    Difficulty of Paying Living Expenses: Not hard at all   Food Insecurity: No Food Insecurity (11/09/2021)   Received from [facility], [facility]   Hunger Vital Sign    Worried About Running Out of Food in the Last Year: Never true    Ran Out of Food in the Last Year: Never true  Transportation Needs: No Transportation Needs (11/09/2021)   Received from [facility], [facility]   [facility] - Transportation    Lack of Transportation (Medical): No    Lack of Transportation (Non-Medical): No  Physical Activity: Sufficiently Active (11/09/2021)   Received from [facility], [facility]   Exercise Vital Sign    Days of Exercise per Week: 7 days    Minutes of Exercise per Session: 30 min  Stress: Not on file  Social Connections: Not on file  Intimate Partner Violence: Not At Risk (12/14/2020)   Humiliation, Afraid, Rape, and Kick questionnaire    Fear of Current or Ex-Partner: No    Emotionally Abused: No    Physically Abused: No    Sexually Abused: No    Family History: Family History  Problem Relation Age of Onset   Depression Mother    Anxiety disorder Mother    Thyroid disease Mother    Diabetes Paternal Grandmother    COPD Paternal Grandfather     Review of Systems: ROS  Physical Exam: Vital Signs BP 116/74 (BP Location: Left Wrist, [name] Position: Sitting, Cuff Size: Small)   Pulse 78   Resp 18   Ht 5\' 2"  (1.575 m)   SpO2 98%   BMI 35.65 kg/m   Physical Exam  Constitutional:      General: Not in acute distress.    Appearance: Normal appearance. Not ill-appearing.  HENT:     Head: Normocephalic and atraumatic.  Eyes:     Pupils: Pupils are equal, round. Cardiovascular:     Rate and Rhythm: Normal rate. Pulmonary:     Effort: No respiratory distress or increased work of breathing.  Speaks in full sentences. Musculoskeletal: Normal range of motion. No lower extremity swelling or edema. No varicosities.  Skin:    General: Skin is warm and dry.     Findings: No erythema or rash.   Neurological:     Mental Status: Alert and oriented to person, place, and time.  Psychiatric:        Mood and Affect: Mood normal.        Behavior: Behavior normal.    Assessment/Plan: The [name] is scheduled for panniculectomy with Dr. Ulice Bold.  Risks, benefits, and alternatives of procedure discussed, questions answered and consent obtained.    Smoking Status: non smoker   Caprini Score: 3; Risk Factors include: Age, BMI, length of planned surgery ; encourage early ambulation  Pictures obtained: previous visit   Post-op Rx sent to pharmacy: oxycodone, Zofran, Keflex   [name] was provided with the General Surgical Risk consent document and Pain Medication Agreement prior to their appointment.  They had adequate time to read through the risk consent documents and Pain Medication Agreement. We also discussed them in person together during this preop appointment. All of their questions were answered to their satisfaction.  Recommended calling if they have any further questions.  Risk consent form and Pain Medication Agreement to be scanned into [name]'s chart.    Electronically signed by: Kelle Darting Giselle Brutus, PA-C 05/19/2023 10:03 AM

## 2023-05-19 NOTE — Progress Notes (Signed)
Patient ID: Theresa [name], female    DOB: 02-16-1996, 27 y.o.   MRN: [id number]  Chief Complaint  Patient presents with   Pre-op Exam    No diagnosis found.   History of Present Illness: Theresa [name] is a 27 y.o.  female  with a history of panniculitis.  She presents for preoperative evaluation for upcoming procedure panniculectomy  with Dr. Ulice Bold on 05/31/2023.    The patient has not had problems with anesthesia. She notes a history of breast reduction and gastric surgery with no issue or complications.  Summary of Previous Visit: Below is a recap of her previous office visit with Dr. Ulice Bold.  The patient is a 27 year old female here for evaluation of her abdomen. She is 5 feet 2 inches tall and weighs 194 pounds with a MI of 35.6 kg/m. Her original weight was 335 pounds. She had the duodenal switch in 2020. She is having to use a lot of creams and lotions to prevent skin breakdown and yeast infections. She has had several surgeries in the past including breast reduction, laparoscopic oophorectomy and gastric duodenal switch. She is not a smoker and does not have diabetes. She has a good abdominal wall strength with no obvious diastases recti and no sign of a hernia on exam.   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   PMH Significant for: panniculitis, anxiety, depression   She notes that she is generally healthy. She denies any issues with her previous surgeries. No personal or family history of blood clots or coagulopathies; no other significant risk factors. She does not smoke or use nicotine products.    Past Medical History: Allergies: Allergies  Allergen Reactions   Promethazine Nausea And Vomiting    Current Medications:  Current Outpatient Medications:    ALTAVERA 0.15-30 MG-MCG tablet, Take 1 tablet by mouth every morning., Disp: , Rfl:    cyclobenzaprine (FLEXERIL) 10 MG tablet, Take 1 tablet (10 mg  total) by mouth at bedtime., Disp: 10 tablet, Rfl: 0   escitalopram (LEXAPRO) 10 MG tablet, Take 10 mg by mouth every morning., Disp: , Rfl:    hydrOXYzine (ATARAX) 25 MG tablet, Take 25 mg by mouth 3 (three) times daily., Disp: , Rfl:    nystatin (MYCOSTATIN/NYSTOP) powder, Apply topically., Disp: , Rfl:    predniSONE (DELTASONE) 20 MG tablet, Take 2 tablets (40 mg total) by mouth daily., Disp: 10 tablet, Rfl: 0  Past Medical Problems: Past Medical History:  Diagnosis Date   Anxiety    h/o no meds   Depression    h/o no meds   Frequent headaches    frequent bad ha'sdue to sinus issues    Past Surgical History: Past Surgical History:  Procedure Laterality Date   BREAST REDUCTION SURGERY  12/2013   BREAST SURGERY     GASTROPLASTY DUODENAL SWITCH     2020   TONSILLECTOMY Bilateral 01/14/2019   Procedure: TONSILLECTOMY;  Surgeon: Vernie Murders, MD;  Location: [facility] OR[facility];  Service: ENT;  Laterality: Bilateral;   WISDOM TOOTH EXTRACTION      Social History: Social History   Socioeconomic History   Marital status: Married    Spouse name: Not on file   Number of children: Not on file   Years of education: Not on file   Highest education level: Not on file  Occupational History   Occupation: Production designer, theatre/television/film     Comment: [employer] and [employer]  Tobacco Use   Smoking  status: Never    Passive exposure: Never   Smokeless tobacco: Never  Vaping Use   Vaping status: Never Used  Substance and Sexual Activity   Alcohol use: Not Currently    Comment: Social, no use since 08/2020   Drug use: Never   Sexual activity: Yes    Birth control/protection: Pill  Other Topics Concern   Not on file  Social History Narrative   ** Merged History Encounter **       Social Determinants of Health   Financial Resource Strain: Low Risk  (11/09/2021)   Received [facility], [facility]   Overall Financial Resource Strain (CARDIA)    Difficulty of Paying Living Expenses: Not hard at all   Food Insecurity: No Food Insecurity (11/09/2021)   Received from [facility], [facility]   Hunger Vital Sign    Worried About Running Out of Food in the Last Year: Never true    Ran Out of Food in the Last Year: Never true  Transportation Needs: No Transportation Needs (11/09/2021)   Received from [facility], [facility]   [facility] - Transportation    Lack of Transportation (Medical): No    Lack of Transportation (Non-Medical): No  Physical Activity: Sufficiently Active (11/09/2021)   Received from [facility], [facility]   Exercise Vital Sign    Days of Exercise per Week: 7 days    Minutes of Exercise per Session: 30 min  Stress: Not on file  Social Connections: Not on file  Intimate Partner Violence: Not At Risk (12/14/2020)   Humiliation, Afraid, Rape, and Kick questionnaire    Fear of Current or Ex-Partner: No    Emotionally Abused: No    Physically Abused: No    Sexually Abused: No    Family History: Family History  Problem Relation Age of Onset   Depression Mother    Anxiety disorder Mother    Thyroid disease Mother    Diabetes Paternal Grandmother    COPD Paternal Grandfather     Review of Systems: ROS  Physical Exam: Vital Signs BP 116/74 (BP Location: Left Wrist, Patient Position: Sitting, Cuff Size: Small)   Pulse 78   Resp 18   Ht 5\' 2"  (1.575 m)   SpO2 98%   BMI 35.65 kg/m   Physical Exam  Constitutional:      General: Not in acute distress.    Appearance: Normal appearance. Not ill-appearing.  HENT:     Head: Normocephalic and atraumatic.  Eyes:     Pupils: Pupils are equal, round. Cardiovascular:     Rate and Rhythm: Normal rate. Pulmonary:     Effort: No respiratory distress or increased work of breathing.  Speaks in full sentences. Musculoskeletal: Normal range of motion. No lower extremity swelling or edema. No varicosities.  Skin:    General: Skin is warm and dry.     Findings: No erythema or rash.   Neurological:     Mental Status: Alert and oriented to person, place, and time.  Psychiatric:        Mood and Affect: Mood normal.        Behavior: Behavior normal.    Assessment/Plan: The patient is scheduled for panniculectomy with Dr. Ulice Bold.  Risks, benefits, and alternatives of procedure discussed, questions answered and consent obtained.    Smoking Status: non smoker   Caprini Score: 3; Risk Factors include: Age, BMI, length of planned surgery ; encourage early ambulation  Pictures obtained: previous visit   Post-op Rx sent to pharmacy: oxycodone, Zofran, Keflex   Patient was provided with the General Surgical Risk consent document and Pain Medication Agreement prior to their appointment.  They had adequate time to read through the risk consent documents and Pain Medication Agreement. We also discussed them in person together during this preop appointment. All of their questions were answered to their satisfaction.  Recommended calling if they have any further questions.  Risk consent form and Pain Medication Agreement to be scanned into patient's chart.    Electronically signed by: Kelle Darting Derren Suydam, PA-C 05/19/2023 10:03 AM

## 2023-05-26 ENCOUNTER — Other Ambulatory Visit: Payer: Self-pay

## 2023-05-26 ENCOUNTER — Encounter (HOSPITAL_COMMUNITY): Payer: Self-pay | Admitting: Plastic Surgery

## 2023-05-26 NOTE — Progress Notes (Signed)
Mrs. Jamari Waldron denies chest pain or shortness of breath.  Patient denies having any s/s of Covid in her household, also denies any known exposure to Covid.  Mrs. Hately deneis  any s/s of upper or lower respiratory in the past 8 weeks.   Mrs. Hately's PCP is [facility], [place],  [place].

## 2023-05-31 ENCOUNTER — Encounter (HOSPITAL_COMMUNITY)
Admission: RE | Disposition: A | Discharge status: Home / Self Care | Payer: Self-pay | Source: Home / Self Care | Attending: Plastic Surgery

## 2023-05-31 ENCOUNTER — Encounter (HOSPITAL_COMMUNITY): Payer: Self-pay | Admitting: Plastic Surgery

## 2023-05-31 ENCOUNTER — Ambulatory Visit (HOSPITAL_BASED_OUTPATIENT_CLINIC_OR_DEPARTMENT_OTHER): Payer: Medicaid Other | Admitting: Anesthesiology

## 2023-05-31 ENCOUNTER — Observation Stay (HOSPITAL_COMMUNITY)
Admission: RE | Admit: 2023-05-31 | Discharge: 2023-06-01 | Disposition: A | Discharge status: Home / Self Care | Payer: Medicaid Other | Attending: Plastic Surgery | Admitting: Plastic Surgery

## 2023-05-31 ENCOUNTER — Ambulatory Visit (HOSPITAL_COMMUNITY): Payer: Medicaid Other | Admitting: Anesthesiology

## 2023-05-31 ENCOUNTER — Other Ambulatory Visit: Payer: Self-pay

## 2023-05-31 DIAGNOSIS — M793 Panniculitis, unspecified: Secondary | ICD-10-CM | POA: Diagnosis not present

## 2023-05-31 HISTORY — DX: Personal history of other diseases of the digestive system: Z87.19

## 2023-05-31 HISTORY — PX: PANNICULECTOMY: SHX5360

## 2023-05-31 HISTORY — DX: Dysmenorrhea, unspecified: N94.6

## 2023-05-31 HISTORY — DX: Other complications of anesthesia, initial encounter: T88.59XA

## 2023-05-31 HISTORY — PX: LIPOSUCTION: SHX10

## 2023-05-31 LAB — CBC
HCT: 38.9 % (ref 36.0–46.0)
Hemoglobin: 12.3 g/dL (ref 12.0–15.0)
MCH: 25.9 pg — ABNORMAL LOW (ref 26.0–34.0)
MCHC: 31.6 g/dL (ref 30.0–36.0)
MCV: 81.9 fL (ref 80.0–100.0)
Platelets: 313 10*3/uL (ref 150–400)
RBC: 4.75 MIL/uL (ref 3.87–5.11)
RDW: 13.8 % (ref 11.5–15.5)
WBC: 6.4 10*3/uL (ref 4.0–10.5)
nRBC: 0 % (ref 0.0–0.2)

## 2023-05-31 LAB — HIV ANTIBODY (ROUTINE TESTING W REFLEX): HIV Screen 4th Generation wRfx: NONREACTIVE

## 2023-05-31 LAB — POCT PREGNANCY, URINE: Preg Test, Ur: NEGATIVE

## 2023-05-31 SURGERY — PANNICULECTOMY
Anesthesia: General | Site: Abdomen

## 2023-05-31 MED ORDER — ORAL CARE MOUTH RINSE: 15.0000 mL | Freq: Once | OROMUCOSAL | Status: AC

## 2023-05-31 MED ORDER — HYDROMORPHONE HCL 1 MG/ML IJ SOLN
INTRAMUSCULAR | Status: AC
  Filled 2023-05-31: qty 1

## 2023-05-31 MED ORDER — SENNA 8.6 MG PO TABS
1.0000 | ORAL_TABLET | Freq: Two times a day (BID) | ORAL | Status: DC
  Administered 2023-05-31 – 2023-06-01 (×2): 8.6 mg via ORAL
  Filled 2023-05-31 (×2): qty 1

## 2023-05-31 MED ORDER — HYDROMORPHONE HCL 1 MG/ML IJ SOLN
1.0000 mg | INTRAMUSCULAR | Status: DC | PRN
  Administered 2023-05-31 (×2): 1 mg via INTRAVENOUS
  Filled 2023-05-31: qty 1

## 2023-05-31 MED ORDER — DEXAMETHASONE SODIUM PHOSPHATE 10 MG/ML IJ SOLN
INTRAMUSCULAR | Status: AC
  Filled 2023-05-31: qty 1

## 2023-05-31 MED ORDER — LIDOCAINE-EPINEPHRINE 1 %-1:100000 IJ SOLN
INTRAMUSCULAR | Status: DC | PRN
  Administered 2023-05-31: 15 mL

## 2023-05-31 MED ORDER — LACTATED RINGERS IV SOLN: INTRAVENOUS | Status: DC

## 2023-05-31 MED ORDER — FENTANYL CITRATE (PF) 100 MCG/2ML IJ SOLN: 25.0000 ug | INTRAMUSCULAR | Status: DC | PRN

## 2023-05-31 MED ORDER — SUGAMMADEX SODIUM 200 MG/2ML IV SOLN
INTRAVENOUS | Status: DC | PRN
  Administered 2023-05-31: 200 mg via INTRAVENOUS

## 2023-05-31 MED ORDER — OXYCODONE HCL 5 MG PO TABS
5.0000 mg | ORAL_TABLET | ORAL | Status: DC | PRN
  Administered 2023-05-31 – 2023-06-01 (×4): 10 mg via ORAL
  Filled 2023-05-31 (×4): qty 2

## 2023-05-31 MED ORDER — MIDAZOLAM HCL 2 MG/2ML IJ SOLN
INTRAMUSCULAR | Status: AC
  Filled 2023-05-31: qty 2

## 2023-05-31 MED ORDER — CHLORHEXIDINE GLUCONATE CLOTH 2 % EX PADS: 6.0000 | MEDICATED_PAD | Freq: Once | CUTANEOUS | Status: DC

## 2023-05-31 MED ORDER — LIDOCAINE 2% (20 MG/ML) 5 ML SYRINGE
INTRAMUSCULAR | Status: DC | PRN
  Administered 2023-05-31: 80 mg via INTRAVENOUS

## 2023-05-31 MED ORDER — DEXAMETHASONE SODIUM PHOSPHATE 10 MG/ML IJ SOLN
INTRAMUSCULAR | Status: DC | PRN
  Administered 2023-05-31: 10 mg via INTRAVENOUS

## 2023-05-31 MED ORDER — MIDAZOLAM HCL 2 MG/2ML IJ SOLN
INTRAMUSCULAR | Status: DC | PRN
  Administered 2023-05-31: 2 mg via INTRAVENOUS

## 2023-05-31 MED ORDER — LIDOCAINE-EPINEPHRINE 1 %-1:100000 IJ SOLN
INTRAMUSCULAR | Status: AC
  Filled 2023-05-31: qty 1

## 2023-05-31 MED ORDER — TRANEXAMIC ACID-NACL 1000-0.7 MG/100ML-% IV SOLN
INTRAVENOUS | Status: DC | PRN
  Administered 2023-05-31: 1000 mg via INTRAVENOUS

## 2023-05-31 MED ORDER — PROPOFOL 10 MG/ML IV BOLUS
INTRAVENOUS | Status: AC
  Filled 2023-05-31: qty 20

## 2023-05-31 MED ORDER — ONDANSETRON 4 MG PO TBDP: 4.0000 mg | ORAL_TABLET | Freq: Four times a day (QID) | ORAL | Status: DC | PRN

## 2023-05-31 MED ORDER — ACETAMINOPHEN 500 MG PO TABS
1000.0000 mg | ORAL_TABLET | Freq: Once | ORAL | Status: AC
  Administered 2023-05-31: 1000 mg via ORAL
  Filled 2023-05-31: qty 2

## 2023-05-31 MED ORDER — FENTANYL CITRATE (PF) 250 MCG/5ML IJ SOLN
INTRAMUSCULAR | Status: DC | PRN
  Administered 2023-05-31: 125 ug via INTRAVENOUS
  Administered 2023-05-31: 25 ug via INTRAVENOUS

## 2023-05-31 MED ORDER — PROPOFOL 10 MG/ML IV BOLUS
INTRAVENOUS | Status: DC | PRN
Start: 2023-05-31 — End: 2023-05-31
  Administered 2023-05-31: 180 mg via INTRAVENOUS

## 2023-05-31 MED ORDER — MELATONIN 3 MG PO TABS: 3.0000 mg | ORAL_TABLET | Freq: Every evening | ORAL | Status: DC | PRN

## 2023-05-31 MED ORDER — AMISULPRIDE (ANTIEMETIC) 5 MG/2ML IV SOLN: 10.0000 mg | Freq: Once | INTRAVENOUS | Status: DC | PRN

## 2023-05-31 MED ORDER — BUPIVACAINE LIPOSOME 1.3 % IJ SUSP
INTRAMUSCULAR | Status: DC | PRN
Start: 2023-05-31 — End: 2023-05-31
  Administered 2023-05-31: 20 mL

## 2023-05-31 MED ORDER — CEFAZOLIN SODIUM-DEXTROSE 2-4 GM/100ML-% IV SOLN
2.0000 g | Freq: Three times a day (TID) | INTRAVENOUS | Status: DC
  Administered 2023-05-31 – 2023-06-01 (×2): 2 g via INTRAVENOUS
  Filled 2023-05-31 (×2): qty 100

## 2023-05-31 MED ORDER — POLYETHYLENE GLYCOL 3350 17 G PO PACK: 17.0000 g | PACK | Freq: Every day | ORAL | Status: DC | PRN

## 2023-05-31 MED ORDER — SODIUM BICARBONATE 4.2 % IV SOLN
Freq: Once | INTRAVENOUS | Status: AC
  Administered 2023-05-31: 1000 mL via INTRAMUSCULAR
  Filled 2023-05-31: qty 50

## 2023-05-31 MED ORDER — DIPHENHYDRAMINE HCL 50 MG/ML IJ SOLN: 12.5000 mg | Freq: Four times a day (QID) | INTRAMUSCULAR | Status: DC | PRN

## 2023-05-31 MED ORDER — TRANEXAMIC ACID-NACL 1000-0.7 MG/100ML-% IV SOLN
INTRAVENOUS | Status: AC
  Filled 2023-05-31: qty 100

## 2023-05-31 MED ORDER — PHENYLEPHRINE 80 MCG/ML (10ML) SYRINGE FOR IV PUSH (FOR BLOOD PRESSURE SUPPORT)
PREFILLED_SYRINGE | INTRAVENOUS | Status: AC
  Filled 2023-05-31: qty 10

## 2023-05-31 MED ORDER — BUPIVACAINE LIPOSOME 1.3 % IJ SUSP
INTRAMUSCULAR | Status: AC
  Filled 2023-05-31: qty 20

## 2023-05-31 MED ORDER — CEFAZOLIN SODIUM-DEXTROSE 2-4 GM/100ML-% IV SOLN
2.0000 g | INTRAVENOUS | Status: AC
  Administered 2023-05-31: 2 g via INTRAVENOUS
  Filled 2023-05-31: qty 100

## 2023-05-31 MED ORDER — FENTANYL CITRATE (PF) 250 MCG/5ML IJ SOLN
INTRAMUSCULAR | Status: AC
  Filled 2023-05-31: qty 5

## 2023-05-31 MED ORDER — ONDANSETRON HCL 4 MG/2ML IJ SOLN
INTRAMUSCULAR | Status: AC
  Filled 2023-05-31: qty 2

## 2023-05-31 MED ORDER — ROCURONIUM BROMIDE 10 MG/ML (PF) SYRINGE
PREFILLED_SYRINGE | INTRAVENOUS | Status: AC
  Filled 2023-05-31: qty 10

## 2023-05-31 MED ORDER — LIDOCAINE 2% (20 MG/ML) 5 ML SYRINGE
INTRAMUSCULAR | Status: AC
  Filled 2023-05-31: qty 5

## 2023-05-31 MED ORDER — ESMOLOL HCL 100 MG/10ML IV SOLN
INTRAVENOUS | Status: AC
  Filled 2023-05-31: qty 10

## 2023-05-31 MED ORDER — ONDANSETRON HCL 4 MG/2ML IJ SOLN
4.0000 mg | Freq: Four times a day (QID) | INTRAMUSCULAR | Status: DC | PRN
  Administered 2023-05-31 – 2023-06-01 (×3): 4 mg via INTRAVENOUS
  Filled 2023-05-31 (×2): qty 2

## 2023-05-31 MED ORDER — DEXMEDETOMIDINE HCL IN NACL 80 MCG/20ML IV SOLN
INTRAVENOUS | Status: DC | PRN
  Administered 2023-05-31 (×2): 8 ug via INTRAVENOUS

## 2023-05-31 MED ORDER — CHLORHEXIDINE GLUCONATE 0.12 % MT SOLN
15.0000 mL | Freq: Once | OROMUCOSAL | Status: AC
  Administered 2023-05-31: 15 mL via OROMUCOSAL
  Filled 2023-05-31: qty 15

## 2023-05-31 MED ORDER — ACETAMINOPHEN 325 MG PO TABS
325.0000 mg | ORAL_TABLET | Freq: Four times a day (QID) | ORAL | Status: DC
  Administered 2023-05-31 – 2023-06-01 (×2): 325 mg via ORAL
  Filled 2023-05-31 (×2): qty 1

## 2023-05-31 MED ORDER — KCL IN DEXTROSE-NACL 20-5-0.45 MEQ/L-%-% IV SOLN
INTRAVENOUS | Status: DC
  Filled 2023-05-31 (×2): qty 1000

## 2023-05-31 MED ORDER — BUPIVACAINE HCL 0.25 % IJ SOLN
INTRAMUSCULAR | Status: DC | PRN
  Administered 2023-05-31: 15 mL

## 2023-05-31 MED ORDER — EPHEDRINE 5 MG/ML INJ
INTRAVENOUS | Status: AC
  Filled 2023-05-31: qty 5

## 2023-05-31 MED ORDER — DIPHENHYDRAMINE HCL 12.5 MG/5ML PO ELIX: 12.5000 mg | ORAL_SOLUTION | Freq: Four times a day (QID) | ORAL | Status: DC | PRN

## 2023-05-31 MED ORDER — BUPIVACAINE HCL (PF) 0.25 % IJ SOLN
INTRAMUSCULAR | Status: AC
  Filled 2023-05-31: qty 30

## 2023-05-31 MED ORDER — ROCURONIUM BROMIDE 10 MG/ML (PF) SYRINGE
PREFILLED_SYRINGE | INTRAVENOUS | Status: DC | PRN
  Administered 2023-05-31: 60 mg via INTRAVENOUS

## 2023-05-31 MED ORDER — IBUPROFEN 400 MG PO TABS
400.0000 mg | ORAL_TABLET | Freq: Four times a day (QID) | ORAL | Status: DC
  Administered 2023-05-31: 400 mg via ORAL
  Filled 2023-05-31 (×4): qty 1

## 2023-05-31 MED ORDER — ONDANSETRON HCL 4 MG/2ML IJ SOLN
INTRAMUSCULAR | Status: DC | PRN
  Administered 2023-05-31: 4 mg via INTRAVENOUS

## 2023-05-31 MED ORDER — 0.9 % SODIUM CHLORIDE (POUR BTL) OPTIME
TOPICAL | Status: DC | PRN
  Administered 2023-05-31: 1000 mL

## 2023-05-31 SURGICAL SUPPLY — 60 items
ADH SKN CLS APL DERMABOND .7 (GAUZE/BANDAGES/DRESSINGS) ×3
AGENT HMST PWDR BTL CLGN 5GM (Miscellaneous) ×1 IMPLANT
APPLIER CLIP 9.375 MED OPEN (MISCELLANEOUS) ×1
APR CLP MED 9.3 20 MLT OPN (MISCELLANEOUS) ×1
BAG COUNTER SPONGE SURGICOUNT (BAG) ×1 IMPLANT
BAG SPNG CNTER NS LX DISP (BAG)
BINDER ABDOMINAL 10 UNV 27-48 (MISCELLANEOUS) IMPLANT
BINDER ABDOMINAL 12 SM 30-45 (SOFTGOODS) ×1 IMPLANT
BIOPATCH RED 1 DISK 7.0 (GAUZE/BANDAGES/DRESSINGS) IMPLANT
BLADE CLIPPER SURG (BLADE) IMPLANT
CLIP APPLIE 9.375 MED OPEN (MISCELLANEOUS) IMPLANT
COLLAGEN CELLERATERX 5 GRAM (Miscellaneous) IMPLANT
DERMABOND ADVANCED .7 DNX12 (GAUZE/BANDAGES/DRESSINGS) ×2 IMPLANT
DRAIN CHANNEL 19F RND (DRAIN) IMPLANT
DRAIN RELI 100 BL SUC LF ST (DRAIN) ×2
DRAPE HALF SHEET 40X57 (DRAPES) ×2 IMPLANT
DRAPE INCISE IOBAN 66X45 STRL (DRAPES) ×1 IMPLANT
DRESSING MEPILEX FLEX 4X4 (GAUZE/BANDAGES/DRESSINGS) IMPLANT
DRSG MEPILEX FLEX 4X4 (GAUZE/BANDAGES/DRESSINGS) ×1
DRSG MEPILEX POST OP 4X12 (GAUZE/BANDAGES/DRESSINGS) IMPLANT
DRSG TEGADERM 4X4.75 (GAUZE/BANDAGES/DRESSINGS) IMPLANT
ELECT BLADE 4.0 EZ CLEAN MEGAD (MISCELLANEOUS) ×1
ELECT REM PT RETURN 9FT ADLT (ELECTROSURGICAL) ×1
ELECTRODE BLDE 4.0 EZ CLN MEGD (MISCELLANEOUS) ×1 IMPLANT
ELECTRODE REM PT RTRN 9FT ADLT (ELECTROSURGICAL) ×1 IMPLANT
EVACUATOR SILICONE 100CC (DRAIN) IMPLANT
GAUZE PAD ABD 8X10 STRL (GAUZE/BANDAGES/DRESSINGS) ×2 IMPLANT
GLOVE SURG ENC MOIS LTX SZ6.5 (GLOVE) ×3 IMPLANT
GLOVE SURG ENC MOIS LTX SZ7.5 (GLOVE) ×2 IMPLANT
GOWN STRL REUS W/ TWL LRG LVL3 (GOWN DISPOSABLE) ×2 IMPLANT
GOWN STRL REUS W/TWL LRG LVL3 (GOWN DISPOSABLE) ×2
KIT BASIN OR (CUSTOM PROCEDURE TRAY) ×1 IMPLANT
NDL HYPO 25X1 1.5 SAFETY (NEEDLE) ×1 IMPLANT
NEEDLE HYPO 25X1 1.5 SAFETY (NEEDLE) ×1
NS IRRIG 1000ML POUR BTL (IV SOLUTION) ×1 IMPLANT
PACK GENERAL/GYN (CUSTOM PROCEDURE TRAY) ×1 IMPLANT
PACK UNIVERSAL I (CUSTOM PROCEDURE TRAY) ×1 IMPLANT
PENCIL SMOKE EVACUATOR (MISCELLANEOUS) ×1 IMPLANT
PIN SAFETY STERILE (MISCELLANEOUS) IMPLANT
SLEEVE SCD COMPRESS KNEE MED (STOCKING) ×1 IMPLANT
SPONGE T-LAP 18X18 ~~LOC~~+RFID (SPONGE) IMPLANT
STAPLER VISISTAT 35W (STAPLE) ×1 IMPLANT
STRIP CLOSURE SKIN 1/2X4 (GAUZE/BANDAGES/DRESSINGS) IMPLANT
SUT MNCRL AB 3-0 PS2 18 (SUTURE) IMPLANT
SUT MNCRL AB 4-0 PS2 18 (SUTURE) ×4 IMPLANT
SUT MON AB 3-0 SH 27 (SUTURE)
SUT MON AB 3-0 SH27 (SUTURE) ×2 IMPLANT
SUT MON AB 5-0 PS2 18 (SUTURE) ×2 IMPLANT
SUT PDS AB 2-0 CT1 27 (SUTURE) ×2 IMPLANT
SUT PDS AB 3-0 SH 27 (SUTURE) ×2 IMPLANT
SUT SILK 3 0 SH 30 (SUTURE) IMPLANT
SUT VIC AB 3-0 SH 27 (SUTURE)
SUT VIC AB 3-0 SH 27X BRD (SUTURE) ×1 IMPLANT
SUT VICRYL 4-0 PS2 18IN ABS (SUTURE) ×2 IMPLANT
SYR CONTROL 10ML LL (SYRINGE) ×1 IMPLANT
TOWEL GREEN STERILE FF (TOWEL DISPOSABLE) ×2 IMPLANT
TRAY FOLEY W/BAG SLVR 16FR (SET/KITS/TRAYS/PACK)
TRAY FOLEY W/BAG SLVR 16FR ST (SET/KITS/TRAYS/PACK) IMPLANT
TUBING INFILTRATION IT-10001 (TUBING) ×1 IMPLANT
UNDERPAD 30X36 HEAVY ABSORB (UNDERPADS AND DIAPERS) ×2 IMPLANT

## 2023-05-31 NOTE — Discharge Instructions (Signed)
INSTRUCTIONS FOR AFTER ABDOMINAL SURGERY  You will likely have some questions about what to expect following your operation.  The following information will help you and your family understand what to expect when you get home.  Following these guidelines will help ensure a smooth recovery and reduce risks of complications.  Postoperative instructions include information on: diet, wound care, medications and physical activity.  AFTER SURGERY Expect to go home after the procedure.  In some cases, you may need to spend one night in the hospital for observation.  DIET This surgery does not require a specific diet.  However, the healthier you eat the better your body can heal. It is important to increasing your protein intake.  Limit foods with high sugar and  carbohydrate content.  Focus on vegetables, meat and other protein sources if you are vegan or vegetarian.  If you undergo liposuction during your procedure it is very important to drink 8 oz of water every hour while awake for 2 days.  If your urine is bright yellow, then it is concentrated, and you need to drink more water.  If you find you are persistently nauseated or unable to take in liquids let us know.  NO TOBACCO USE or EXPOSURE.  This will slow your healing process and increase the risk of a wound.  WOUND CARE Leave the abdominal binder in place for 3 days.  Then you can remove it and shower.  Replace the binder or spanx after your shower.   You may have Topifoam or Lipofoam on.  It is soft and spongy and helps keep you from getting creases if you have liposuction.  This can be removed before the shower and then replaced.  If you need more it is available on [employer] as lipofoam.  If you have steri-strips / tape directly attached to your skin leave them in place. It is OK to get these wet.  No baths, pools or hot tubs for four weeks. We close your incision to leave the smallest and best-looking scar. No ointment or creams on your incisions  until cleared by your surgeon.  No Neosporin (Too many skin reactions with this one).  After the steri-strips are off can use Mederma or Skinuva and start massaging the scar. Continue to wear the binder/spanx or Ace wrap around the clock, including while sleeping, for 6 weeks. This provides added comfort and helps reduce the fluid accumulation at the surgery site.  ACTIVITY No heavy lifting until cleared by the doctor.  For example, no more than a half-gallon of milk.  It is OK to walk and you are encouraged to move your legs to help decrease your risk of getting a blood clot.  It will also help keep you from getting deconditioned.  Every 1 to 2 hours get up and walk for 5 minutes. This will help with a quicker recovery back to normal.  Let pain be your guide so you don't do too much.     SLEEPING / RESTING Sleeping and resting should be in the jack-knife or bent forward position with your head elevated.  This will help reduce pulling on your abdominal incision.  You can elevate your head and upper back with a few pillows and place a pillow under your knees.  Avoid stomach sleeping for 3 months.   WORK Everyone returns to work at different times. As a rough guide, most people take 1 - 2 weeks off prior to returning to work. If you need documentation for your   job give them to the front staff for processing.  DRIVING Arrange for someone to bring you home from the hospital.  You may be able to drive a few days after surgery but not while taking any narcotics or valium.  This is for your safety as well as others sharing the road with you.  BOWEL MOVEMENTS Constipation can occur after anesthesia and while taking pain medication.  It is important to stay ahead for your comfort.  We recommend taking Milk of Magnesia (2 tablespoons; twice a day) while taking the pain pills.  MEDICATIONS (you may receive and should be started after surgery) At your preoperative visit for you history and physical you were  given the following medications: Antibiotic: Start this medication when you get home and take according to the instructions on the bottle. Zofran 4 mg:  This is to treat nausea and vomiting.  You can take this every 6 hours as needed and only if needed. Norco (hydrocodone/acetaminophen) 5/325 mg:  This is only to be used after you have taken the Motrin or the Tylenol. Every 8 hours as needed.  Over the counter Medication to take: Ibuprofen (Motrin) 600 mg:  Take this every 6 hours.  If you have additional pain then take 500 mg of the Tylenol.  Only take the Norco after you have tried these two. MiraLAX or stool softener of choice: Take this according to the bottle if you take the Norco.  WHEN TO CALL Call your surgeon's office if any of the following occur:  Fever 101 degrees F or greater  Excessive bleeding or fluid from the incision site.  Pain that increases over time without aid from the medications  Redness, warmth, or pus draining from incision sites  Persistent nausea or inability to take in liquids  Severe misshapen area that underwent the operation.   

## 2023-05-31 NOTE — Transfer of Care (Signed)
Immediate Anesthesia Transfer of Care Note  Patient: Nalaysia Vasbinder  Procedure(s) Performed: PANNICULECTOMY (Abdomen) LIPOSUCTION (Abdomen)  Patient Location: PACU  Anesthesia Type:General  Level of Consciousness: drowsy  Airway & Oxygen Therapy: Patient Spontanous Breathing  Post-op Assessment: Report given to RN and Post -op Vital signs reviewed and stable  Post vital signs: Reviewed and stable  Last Vitals:  Vitals Value Taken Time  BP 125/85 05/31/23 1847  Temp 37 C 05/31/23 1847  Pulse 68 05/31/23 1855  Resp 23 05/31/23 1855  SpO2 93 % 05/31/23 1855  Vitals shown include unfiled device data.  Last Pain:  Vitals:   05/31/23 1847  TempSrc:   PainSc: Asleep      Patients Stated Pain Goal: 3 (05/31/23 1137)  Complications: No notable events documented.

## 2023-05-31 NOTE — Anesthesia Preprocedure Evaluation (Signed)
Anesthesia Evaluation  Patient identified by MRN, date of birth, ID band Patient awake    Reviewed: Allergy & Precautions, NPO status , Patient's Chart, lab work & pertinent test results  Airway Mallampati: I  TM Distance: >3 FB Neck ROM: Full    Dental   Pulmonary neg pulmonary ROS   breath sounds clear to auscultation       Cardiovascular negative cardio ROS  Rhythm:Regular Rate:Normal     Neuro/Psych  Headaches    GI/Hepatic Neg liver ROS, hiatal hernia,,,  Endo/Other  negative endocrine ROS    Renal/GU negative Renal ROS     Musculoskeletal   Abdominal   Peds  Hematology negative hematology ROS (+)   Anesthesia Other Findings   Reproductive/Obstetrics                              Lab Results  Component Value Date   WBC 6.4 05/31/2023   HGB 12.3 05/31/2023   HCT 38.9 05/31/2023   MCV 81.9 05/31/2023   PLT 313 05/31/2023   Lab Results  Component Value Date   NA 140 07/08/2016   CL 104 07/08/2016   K 4.3 07/08/2016   CO2 31 07/08/2016   BUN 9 07/08/2016   CREATININE 0.67 07/08/2016   GFR 116.32 07/08/2016   CALCIUM 9.1 07/08/2016   ALBUMIN 4.2 07/08/2016   GLUCOSE 76 07/08/2016    Anesthesia Physical Anesthesia Plan  ASA: 2  Anesthesia Plan: General   Post-op Pain Management: Tylenol PO (pre-op)* and Toradol IV (intra-op)*   Induction: Intravenous  PONV Risk Score and Plan: 3 and Dexamethasone, Ondansetron, Midazolam and Treatment may vary due to age or medical condition  Airway Management Planned: Oral ETT  Additional Equipment:   Intra-op Plan:   Post-operative Plan: Extubation in OR  Informed Consent: I have reviewed the patients History and Physical, chart, labs and discussed the procedure including the risks, benefits and alternatives for the proposed anesthesia with the patient or authorized representative who has indicated his/her understanding and  acceptance.     Dental advisory given  Plan Discussed with: CRNA  Anesthesia Plan Comments:          Anesthesia Quick Evaluation

## 2023-05-31 NOTE — Anesthesia Postprocedure Evaluation (Signed)
Anesthesia Post Note  Patient: Theresa [name]  Procedure(s) Performed: PANNICULECTOMY (Abdomen) LIPOSUCTION (Abdomen)     Patient location during evaluation: PACU Anesthesia Type: General Level of consciousness: awake and alert Pain management: pain level controlled Vital Signs Assessment: post-procedure vital signs reviewed and stable Respiratory status: spontaneous breathing, nonlabored ventilation and respiratory function stable Cardiovascular status: blood pressure returned to baseline and stable Postop Assessment: no apparent nausea or vomiting Anesthetic complications: no   No notable events documented.  Last Vitals:  Vitals:   05/31/23 1930 05/31/23 1952  BP: 126/82 125/79  Pulse: 63 63  Resp: 20 18  Temp: 37.1 C 37.1 C  SpO2: 94% 99%    Last Pain:  Vitals:   05/31/23 1952  TempSrc: Oral  PainSc: 6                  Britlee Skolnik

## 2023-05-31 NOTE — Anesthesia Procedure Notes (Signed)
Procedure Name: Intubation Date/Time: 05/31/2023 4:26 PM  Performed by: Darlina Guys, CRNAPre-anesthesia Checklist: Patient identified, Emergency Drugs available, Suction available and Patient being monitored Patient Re-evaluated:Patient Re-evaluated prior to induction Oxygen Delivery Method: Circle system utilized Preoxygenation: Pre-oxygenation with 100% oxygen Induction Type: IV induction Ventilation: Mask ventilation without difficulty and Oral airway inserted - appropriate to patient size Laryngoscope Size: Mac and 3 Grade View: Grade I Tube type: Oral Tube size: 7.0 mm Number of attempts: 1 Airway Equipment and Method: Stylet and Oral airway Placement Confirmation: ETT inserted through vocal cords under direct vision, positive ETCO2 and breath sounds checked- equal and bilateral Secured at: 21 cm Tube secured with: Tape Dental Injury: Teeth and Oropharynx as per pre-operative assessment

## 2023-05-31 NOTE — Op Note (Signed)
Operative Report  Date of operation: 05/31/2023  Patient: Theresa [name], MRN: [id number], 26 y.o. female.   Date of birth: December 20, 1995  Location: [facility]  Preoperative Diagnosis:  Panniculitis  Postoperative Diagnosis:  Same  Procedure:  Panniculectomy with liposuction 2266 gm  Surgeon:  Wayland Denis Demiya Magno  Assistant:  Evelena Leyden, PA  Anesthesia:  General  EBL:  50 cc  Drains:  two number 19 blake round drain  Condition:  Stable  Complications: None  Disposition: Recovery Room  Procedure in Detail: Patient was seen the morning of her surgery.  The patient was marked and the surgery plan was reviewed.  An IV was placed and antibiotics were administered.  The patient was taken to the operating room and underwent general anesthesia.  A time out was called and all information was confirmed to be correct. SCD's were placed on the legs.  A pillow was placed under the knees. The patient was prepped and draped in the standard sterile fashion. Local was placed into the incision and 2 stab incisions made laterally.  Tumescent was placed in each flank area. Liposuction was performed for improved contour. The marked lower incision was incised with dissection taken to scarpa's fascia and the rectus abdominus fascia. The skin and subcutaneous tissue was then lifted off the fascia to the level of the umbilicus.   The patient was flexed on the table which aided in determining the amount of abdomen skin that would be safely excised.  The pannus was excised and weighed.  The mons was also suspended with 3-0 Monocryl.  The tissue was irrigated with normal saline solution. Two  #19 blake round drains was placed and secured to the skin with 3-0 Silk.  Cellerate and Experel were placed in the pocket. Closure begun.and  the abdominal wall was closed with buried 3-0 PDS and 3-0 Monocryl.  The skin was closed with the 4-0 Monocryl. Derma bond was applied. ABD's and an  abdominal binder were placed. Patient was allowed to wake up, extubated and taken on a stretcher in the flexed position to the recovery room. Family was notified at the end of the case.   The advanced practice practitioner (APP) assisted throughout the case.  The APP was essential in retraction and counter traction when needed to make the case progress smoothly.  This retraction and assistance made it possible to see the tissue plans for the procedure.  The assistance was needed for blood control, tissue re-approximation and assisted with closure of the incision site.

## 2023-05-31 NOTE — Interval H&P Note (Signed)
History and Physical Interval Note:  05/31/2023 1:22 PM  Theresa [name]  has presented today for surgery, with the diagnosis of Panniculitis.  The various methods of treatment have been discussed with the patient and family. After consideration of risks, benefits and other options for treatment, the patient has consented to  Procedure(s): PANNICULECTOMY (N/A) as a surgical intervention.  The patient's history has been reviewed, patient examined, no change in status, stable for surgery.  I have reviewed the patient's chart and labs.  Questions were answered to the patient's satisfaction.     Alena Bills Danaiya Steadman

## 2023-06-01 ENCOUNTER — Encounter (HOSPITAL_COMMUNITY): Payer: Self-pay | Admitting: Plastic Surgery

## 2023-06-01 DIAGNOSIS — M793 Panniculitis, unspecified: Secondary | ICD-10-CM | POA: Diagnosis not present

## 2023-06-01 NOTE — Progress Notes (Signed)
Patient alert and oriented, void, ambulate. Surgical dressing clean and dry. D/c instructions explain and given all questions answered. Patient d/c home per order.

## 2023-06-01 NOTE — Addendum Note (Signed)
Addendum  created 06/01/23 0902 by Achille Rich, MD   Attestation recorded in Intraprocedure, Intraprocedure Attestations filed

## 2023-06-01 NOTE — Discharge Summary (Signed)
Physician Discharge Summary  Patient ID: Theresa [name] MRN: [id number] DOB/AGE: 27-Nov-1995 27 y.o.  Admit date: 05/31/2023 Discharge date: 06/01/2023  Admission Diagnoses:  Discharge Diagnoses:  Principal Problem:   Panniculitis   Discharged Condition: Given that her surgery had been delayed and started late, she was admitted overnight for observation. She endorses some discomfort overnight for which she required both oral and IV analgesics.  Her husband had been accompanying her overnight and will be assisting with her postoperative recovery at home.  Ambulatory down the hall, tolerating po intake without difficulty. Voiding urine. Abdominal binder in place.  Discussed discharge home into care of her husband with which she is agreeable.  Her discomfort is primarily in the lateral areas where liposuction was performed as well as at the drain tube insertion sites.  Physical exam is reassuring and without obvious seroma or hematoma.  Per RN at bedside, drain output this AM was only 20 cc and 10 cc from the right and left sides respectively.  Discussed what to watch for and she understands to call the office should she have any questions or concerns over the weekend.    Hospital Course: Patient was overnight for observation after panniculectomy performed 05/31/2023 by Dr. Ulice Bold.  Consults: None  Significant Diagnostic Studies: None.  Treatments: surgery: panniculectomy.  Discharge Exam: Blood pressure 117/70, pulse 72, temperature 98.1 F (36.7 C), temperature source Oral, resp. rate 16, height 5\' 2"  (1.575 m), weight 87.5 kg, last menstrual period 05/23/2023, SpO2 98%. General appearance: alert, cooperative, no distress, and resting comfortably in bed. GI: Abdomen is soft, nondistended. No focal areas of significant tenderness.  Umbilicus viable.  No swelling or asymmetries appreciated. No seroma or hematoma.  Bilateral 19 Blake drains remain intact and functional, normal appearing  serosanguinous output in tubes and bulbs bilaterally. Extremities: No lower extremity swelling or edema.  Disposition: Discharge disposition: 01-Home or Self Care       Discharge Instructions     Diet - low sodium heart healthy   Complete by: As directed    Increase activity slowly   Complete by: As directed       Allergies as of 06/01/2023       Reactions   Promethazine Nausea And Vomiting        Medication List     TAKE these medications    Altavera 0.15-30 MG-MCG tablet Generic drug: levonorgestrel-ethinyl estradiol Take 1 tablet by mouth every morning.   cephALEXin 500 MG capsule Commonly known as: Keflex Take 1 capsule (500 mg total) by mouth 4 (four) times daily.   escitalopram 10 MG tablet Commonly known as: LEXAPRO Take 20 mg by mouth every morning.   hydrOXYzine 25 MG tablet Commonly known as: ATARAX Take 25 mg by mouth 3 (three) times daily as needed for anxiety.   nystatin powder Commonly known as: MYCOSTATIN/NYSTOP Apply 1 Application topically daily as needed (yeast).   ondansetron 4 MG tablet Commonly known as: Zofran Take 1 tablet (4 mg total) by mouth every 8 (eight) hours as needed for nausea or vomiting.   oxyCODONE 5 MG immediate release tablet Commonly known as: Roxicodone Take 1 tablet (5 mg total) by mouth every 6 (six) hours as needed for severe pain.        Follow-up Information     Dillingham, Alena Bills, DO Follow up in 10 day(s).   Specialty: Plastic Surgery Contact information: [address] [place] [address] [phone]  [facility] [address] [place], [place] [address] [phone]  Signed: Evelena Leyden 06/01/2023, 9:23 AM

## 2023-06-02 ENCOUNTER — Ambulatory Visit (INDEPENDENT_AMBULATORY_CARE_PROVIDER_SITE_OTHER): Payer: Medicaid Other | Admitting: Physician Assistant

## 2023-06-02 DIAGNOSIS — Z9889 Other specified postprocedural states: Secondary | ICD-10-CM

## 2023-06-02 NOTE — Progress Notes (Signed)
Patient is a pleasant 27 year old female s/p panniculectomy performed 05/31/2023 by Dr. Ulice Bold who joins via telephone for postoperative day 2 check-in.  She was admitted for observation following surgery given the late start time.  However, the surgery went well and a total of 2266 g were removed between pain the size and liposuction.  She was seen the following morning at which time exam was benign and she was discharged.  Today, patient is doing okay.  She states that she still has some significant postoperative discomfort, but that it is symmetric and affecting primarily her bilateral flanks and bilateral drain tube insertion sites.  She states that it is slightly worse with ambulation and other movements, but has been manageable with Tylenol and oxycodone 3 times daily.  She is going to try to mitigate her use of oxycodone as her discomfort improves.  She states that she had approximately 60 cc total output from each drain yesterday.  Denies any chest pain, leg swelling, fevers, difficulty breathing, or other symptoms.  She does have some postprandial nausea.  Discussed introduction of stool softeners since she has not yet had a bowel movement in addition to increased oral hydration.  Recommend continued ambulation as well as compressive garments.  She is sleeping with a pillow underneath her knee to help with flexion at the waist.  Husband has been assisting with her postoperative recovery.  Follow-up next week, as scheduled.  She understands that she can call the clinic should she have any questions or concerns in the interim.

## 2023-06-06 ENCOUNTER — Ambulatory Visit (INDEPENDENT_AMBULATORY_CARE_PROVIDER_SITE_OTHER): Payer: Medicaid Other | Admitting: Plastic Surgery

## 2023-06-06 DIAGNOSIS — M793 Panniculitis, unspecified: Secondary | ICD-10-CM

## 2023-06-06 NOTE — Progress Notes (Signed)
The patient is a 27 year old female here with her husband and son for follow-up after undergoing a panniculectomy on July 31.  She is doing really well.  The dressings are in place.  The drain output is minimal.  I went ahead and took out the left-sided drain.  She should continue with the binder or spanks.  Hopefully will be able to get the other drain out in a week and a half.  She should continue with healthy eating.  Her bowels are moving.  No sign of a hematoma or seroma.

## 2023-06-09 ENCOUNTER — Encounter: Payer: Medicaid Other | Admitting: Plastic Surgery

## 2023-06-13 ENCOUNTER — Ambulatory Visit: Payer: Medicaid Other | Admitting: Physician Assistant

## 2023-06-13 DIAGNOSIS — Z9889 Other specified postprocedural states: Secondary | ICD-10-CM

## 2023-06-13 NOTE — Progress Notes (Signed)
Patient is a pleasant 27 year old female s/p panniculectomy performed 05/31/2023 by Dr. Ulice Bold who presents to clinic for postoperative follow-up.  She was last seen here in clinic on 06/06/2023.  At that time, left-sided drain was removed without complication or difficulty.  Continue with activity modifications and compressive garments.  Plan for removal of remaining drain at next encounter.  Exam was benign.  Today, patient is doing well.  She is accompanied by her son at bedside.  She states that she still mildly sore doing certain movements, but is ambulating well and feels significantly improved.  She states that she is quite pleased with the outcome of her panniculectomy.  Her clothes are fitting better and she feels better.  Her remaining drain output has been consistently less than 25 cc x 1 week.  She is hopeful for removal today.  She denies any drainage, abdominal swelling or tenderness, redness, leg swelling, chest pain, difficulty breathing, fevers, or other symptoms.  On exam, abdomen is soft, nondistended.  Nontender.  Right sided drain intact and functional, normal-appearing clearish light yellow output in drain and bulb.  Removed without complication or difficulty.  Incision CDI.  No appreciable wounds.  Nondraining.  Parts of the incision are mildly firm, but no hypertrophy.  Recommend Vaseline over the incision throughout and gentle mechanical massage.  Continued activity modifications and compressive garments.  She is wearing spanks that go up just underneath her breasts which are appropriate.  Follow-up as scheduled, sooner if she has any concerns.  She looks like she is healing quite well and is doing excellent from a postoperative standpoint.  Picture(s) obtained of the patient and placed in the chart were with the patient's or guardian's permission.

## 2023-06-19 ENCOUNTER — Encounter: Payer: Self-pay | Admitting: Plastic Surgery

## 2023-06-20 ENCOUNTER — Encounter: Payer: Medicaid Other | Admitting: Physician Assistant

## 2023-06-26 NOTE — Progress Notes (Unsigned)
Patient is a pleasant 27 year old female s/p panniculectomy performed 05/31/2023 by Dr. Ulice Bold who presents to clinic for postoperative follow-up.   She was last seen here in clinic on 06/13/2023.  At that time, right-sided drain was removed without complication or difficulty given low volume output (less than 25 cc/day x 1 week) and benign exam.  Recommended Vaseline over the incision throughout and gentle mechanical massage.  Continued activity modifications and compressive garments.  Plan is for her to return to clinic 06/20/2023.  She then presented to [facility] ED 06/18/2019 for due to concerns for surgical site infection for which plastic surgery was consulted.  After being discharged home with a 5-day course of Bactrim, she returned to the ED 06/20/2019 for and there was interval increase in the size of her abdominal fluid collection now measuring 3.1 cm x 40 cm.  She was admitted to plastic surgery service for IV antibiotics including vancomycin and cefepime.  IR was consulted and patient underwent aspiration of the fluid collection and drain placement 06/21/2023.  Today,

## 2023-06-27 ENCOUNTER — Encounter: Payer: Self-pay | Admitting: Physician Assistant

## 2023-06-27 ENCOUNTER — Ambulatory Visit (INDEPENDENT_AMBULATORY_CARE_PROVIDER_SITE_OTHER): Payer: Medicaid Other | Admitting: Physician Assistant

## 2023-06-27 VITALS — BP 111/79 | HR 93 | Temp 98.6°F

## 2023-06-27 DIAGNOSIS — Z9889 Other specified postprocedural states: Secondary | ICD-10-CM

## 2023-06-29 ENCOUNTER — Other Ambulatory Visit: Payer: Self-pay | Admitting: Oncology

## 2023-06-29 DIAGNOSIS — Z006 Encounter for examination for normal comparison and control in clinical research program: Secondary | ICD-10-CM

## 2023-06-29 NOTE — Progress Notes (Signed)
Patient is a pleasant 27 year old female s/p panniculectomy performed 05/31/2023 by Dr. Ulice Bold who presents to clinic for postoperative follow-up.   Her surgery was complicated by [facility] admission for UTI and seroma requiring IR drain placement 06/21/2023.  She was last seen here in clinic on 06/27/2023.  At that time, she states that she has had no problems whatsoever and that her drain output has been scant, less than 10 cc/day.  She states that it was never above 20 cc/day.  She was hopeful for removal, but given recency since it was placed, decided to abstain for a few days.  Her exam was entirely benign.  Recommended continued activity modifications and compressive garments in interim.  Today, patient continues to report scant output from the bulb, less than 5 cc since last encounter.  She is hopeful for drain removal today.  Reviewed her encounter and the drain was placed by vascular interventional radiology.  Instructions were for follow-up with VIR, but the original plastic surgery team can remove the drain.  On exam, abdomen is soft, nondistended.  Drain stitches removed.  Incision CDI throughout.  She appears to be healing quite nicely from her surgery.  Met some resistance while attempting drain tube removal.  Dr. Ulice Bold was fortunately able to evaluate the patient and perform the drain tube removal at bedside.  Minimal bleeding that quickly resolved with compression.  Placed a bordered Mepilex dressing over the top of her drain tube insertion site which appeared healthy.  Follow-up in 1 to 2 weeks for likely final postoperative encounter.  She will call the clinic should she have any questions or concerns in the interim.  Will obtain photos at next visit.

## 2023-06-30 ENCOUNTER — Ambulatory Visit (INDEPENDENT_AMBULATORY_CARE_PROVIDER_SITE_OTHER): Payer: Medicaid Other | Admitting: Physician Assistant

## 2023-06-30 ENCOUNTER — Encounter: Payer: Self-pay | Admitting: Physician Assistant

## 2023-06-30 VITALS — BP 118/81 | HR 92

## 2023-06-30 DIAGNOSIS — Z9889 Other specified postprocedural states: Secondary | ICD-10-CM

## 2023-07-06 ENCOUNTER — Ambulatory Visit (INDEPENDENT_AMBULATORY_CARE_PROVIDER_SITE_OTHER): Payer: Medicaid Other | Admitting: Physician Assistant

## 2023-07-06 VITALS — BP 125/84 | HR 71

## 2023-07-06 DIAGNOSIS — Z9889 Other specified postprocedural states: Secondary | ICD-10-CM

## 2023-07-06 NOTE — Progress Notes (Signed)
Patient is a pleasant 27 year old female s/p panniculectomy performed 05/31/2023 by Dr. Ulice Bold who presents to clinic for postoperative follow-up.   She was last seen here in clinic on 06/30/2023.  At that time, drain was removed without complication.  Only minimal bleeding noted at that resolved with compression.  Placed bordered Mepilex dressing over the top of the drain tube insertion site.  Follow-up in 1 to 2 weeks.  Today, patient is doing well.  She states that she did not have any persistent bleeding after removing her bordered Mepilex dressing following the drain removal last week.  She feels like she is doing great from a postoperative standpoint and does not feel as though there has been any recurrence in her swelling or fluid collection.  She is quite pleased with the outcome of her panniculectomy and feels more comfortable with how she looks and feels.  She has even lost some additional weight since time of surgery.  On exam, abdomen is soft, nondistended.  She does have a small area of firmness where the drain was placed by IR, but no fluctuance or crepitus.  No redness or other overlying skin changes.  Nonindurated.  Suspect that it will improve with mechanical massage.  Panniculectomy incision is well-healed.  Thin scarring, no hypertrophy.  No incisional wounds.  At this point, feel as though it is reasonable for her to increase activity as tolerated, but avoid any strenuous core activity for an additional 2 weeks.  She can discontinue compressive garments after 1 more week.  She will apply Vaseline to her panniculectomy incision for the next week, but then transition to silicone scar gels or sheets.  She feels great and her exam today is benign.  Follow-up only as needed.  Picture(s) obtained of the patient and placed in the chart were with the patient's or guardian's permission.

## 2023-07-28 ENCOUNTER — Encounter: Payer: Self-pay | Admitting: Emergency Medicine

## 2023-07-28 ENCOUNTER — Ambulatory Visit
Admission: EM | Admit: 2023-07-28 | Discharge: 2023-07-28 | Disposition: A | Discharge status: Home / Self Care | Payer: Medicaid Other | Attending: Physician Assistant | Admitting: Physician Assistant

## 2023-07-28 DIAGNOSIS — R509 Fever, unspecified: Secondary | ICD-10-CM | POA: Diagnosis present

## 2023-07-28 DIAGNOSIS — N10 Acute tubulo-interstitial nephritis: Secondary | ICD-10-CM

## 2023-07-28 DIAGNOSIS — R109 Unspecified abdominal pain: Secondary | ICD-10-CM | POA: Diagnosis present

## 2023-07-28 LAB — URINALYSIS, W/ REFLEX TO CULTURE (INFECTION SUSPECTED)
Bilirubin Urine: NEGATIVE
Glucose, UA: NEGATIVE mg/dL
Ketones, ur: NEGATIVE mg/dL
Nitrite: POSITIVE — AB
Protein, ur: NEGATIVE mg/dL
Specific Gravity, Urine: 1.02 (ref 1.005–1.030)
WBC, UA: >50 WBC/hpf (ref 0–5)
pH: 7 (ref 5.0–8.0)

## 2023-07-28 MED ORDER — CEFTRIAXONE SODIUM 1 G IJ SOLR
1.0000 g | Freq: Once | INTRAMUSCULAR | Status: AC
  Administered 2023-07-28: 1 g via INTRAMUSCULAR

## 2023-07-28 MED ORDER — CIPROFLOXACIN HCL 500 MG PO TABS: 500.0000 mg | ORAL_TABLET | Freq: Two times a day (BID) | ORAL | 0 refills | Status: AC

## 2023-07-28 NOTE — ED Triage Notes (Signed)
Patient c/o lower back pain and fever that started 2 days ago.  Patient denies burning when urinating or urinary frequency.

## 2023-07-28 NOTE — ED Provider Notes (Signed)
[facility]    CSN: [id number] Arrival date & time: 07/28/23  1523      History   Chief Complaint Chief Complaint  Patient presents with   Back Pain   Fever    HPI Theresa [name] is a 27 y.o. female presenting for lower back pain and temps up to 102.5 degrees x 2 days.  Reports noticing a temperature up to 102 degrees earlier this afternoon but says she has not taken any antipyretics and her temperature is currently 99.3 degrees.  She denies dysuria, frequency, urgency, abdominal pain, nausea. Reports doing very uncomfortable with any movement.  No chills, body aches, cough, congestion, sore throat, vomiting, diarrhea, etc.  Reports she has a history of recurrent UTIs, pyelonephritis, and febrile UTIs and states it is actually not typical for her to have dysuria and urinary frequency when she has UTIs.  She says last UTI was about 1.5 months ago.  She was hospitalized at that time for postoperative infection after having panniculectomy.  HPI  Past Medical History:  Diagnosis Date   Anxiety    h/o no meds   Complication of anesthesia    Slow to avaken- age 84   Depression    h/o no meds   Dysmenorrhea    Frequent headaches    frequent bad ha'sdue to sinus issues   History of hiatal hernia     Patient Active Problem List   Diagnosis Date Noted   Panniculitis 01/26/2023   Grief reaction 11/06/2016   Generalized anxiety disorder 09/27/2016   Snoring 09/27/2016   Menstrual irregularity 07/10/2016   Obesity 07/10/2016   Cervical cancer screening 07/10/2016   Encounter for immunization 07/10/2016   Encounter for preconception consultation 07/10/2016    Past Surgical History:  Procedure Laterality Date   ABDOMINAL SURGERY     BREAST REDUCTION SURGERY  12/2013   BREAST SURGERY     GASTROPLASTY DUODENAL SWITCH     2020   LIPOSUCTION N/A 05/31/2023   Procedure: LIPOSUCTION;  Surgeon: Peggye Form, DO;  Location: [facility];  Service: Plastics;  Laterality:  N/A;   PANNICULECTOMY N/A 05/31/2023   Procedure: PANNICULECTOMY;  Surgeon: Peggye Form, DO;  Location: [facility] OR;  Service: Plastics;  Laterality: N/A;   TONSILLECTOMY Bilateral 01/14/2019   Procedure: TONSILLECTOMY;  Surgeon: Vernie Murders, MD;  Location: [facility];  Service: ENT;  Laterality: Bilateral;   TUBAL LIGATION  2023   WISDOM TOOTH EXTRACTION      OB History     Gravida  1   Para  0   Term  0   Preterm  0   AB  0   Living  0      SAB  0   IAB  0   Ectopic  0   Multiple  0   Live Births  0            Home Medications    Prior to Admission medications   Medication Sig Start Date End Date Taking? Authorizing Provider  ALTAVERA 0.15-30 MG-MCG tablet Take 1 tablet by mouth every morning. 03/10/22  Yes [provider]  ciprofloxacin (CIPRO) 500 MG tablet Take 1 tablet (500 mg total) by mouth every 12 (twelve) hours for 7 days. 07/28/23 08/04/23 Yes Eusebio Friendly B, PA-C  escitalopram (LEXAPRO) 10 MG tablet Take 20 mg by mouth every morning. 05/12/22  Yes [provider]  hydrOXYzine (ATARAX) 25 MG tablet Take 25 mg by mouth 3 (three) times daily  as needed for anxiety. 05/17/22  Yes [provider]  ondansetron (ZOFRAN) 4 MG tablet Take 1 tablet (4 mg total) by mouth every 8 (eight) hours as needed for nausea or vomiting. 05/19/23   Hedges, Tinnie Gens, PA-C    Family History Family History  Problem Relation Age of Onset   Depression Mother    Anxiety disorder Mother    Thyroid disease Mother    Diabetes Paternal Grandmother    COPD Paternal Grandfather     Social History Social History   Tobacco Use   Smoking status: Never    Passive exposure: Never   Smokeless tobacco: Never  Vaping Use   Vaping status: Never Used  Substance Use Topics   Alcohol use: Yes    Alcohol/week: 3.0 standard drinks of alcohol    Types: 3 Cans of beer per week   Drug use: Never     Allergies   Promethazine   Review of  Systems Review of Systems  Constitutional:  Positive for fatigue and fever. Negative for chills.  HENT:  Negative for congestion, rhinorrhea and sore throat.   Respiratory:  Negative for cough and shortness of breath.   Cardiovascular:  Negative for chest pain.  Gastrointestinal:  Negative for abdominal pain, diarrhea, nausea and vomiting.  Genitourinary:  Positive for flank pain. Negative for decreased urine volume, dysuria, frequency, hematuria, pelvic pain, urgency, vaginal bleeding, vaginal discharge and vaginal pain.  Musculoskeletal:  Positive for back pain.  Skin:  Negative for rash.  Neurological:  Negative for headaches.     Physical Exam Triage Vital Signs ED Triage Vitals  Encounter Vitals Group     BP      Systolic BP Percentile      Diastolic BP Percentile      Pulse      Resp      Temp      Temp src      SpO2      Weight      Height      Head Circumference      Peak Flow      Pain Score      Pain Loc      Pain Education      Exclude from Growth Chart    No data found.  Updated Vital Signs BP 125/85 (BP Location: Right Arm)   Pulse 95   Temp 99.3 F (37.4 C) (Oral)   Resp 14   Ht 5' 2.5" (1.588 m)   Wt 192 lb 14.4 oz (87.5 kg)   LMP 05/28/2023 (Approximate)   SpO2 97%   BMI 34.72 kg/m      Physical Exam Vitals and nursing note reviewed.  Constitutional:      General: She is not in acute distress.    Appearance: Normal appearance. She is not ill-appearing or toxic-appearing.  HENT:     Head: Normocephalic and atraumatic.  Eyes:     General: No scleral icterus.       Right eye: No discharge.        Left eye: No discharge.     Conjunctiva/sclera: Conjunctivae normal.  Cardiovascular:     Rate and Rhythm: Normal rate and regular rhythm.     Heart sounds: Normal heart sounds.  Pulmonary:     Effort: Pulmonary effort is normal. No respiratory distress.     Breath sounds: Normal breath sounds.  Abdominal:     Palpations: Abdomen is soft.      Tenderness: There is no  abdominal tenderness. There is right CVA tenderness and left CVA tenderness.  Musculoskeletal:     Cervical back: Neck supple.  Skin:    General: Skin is dry.  Neurological:     General: No focal deficit present.     Mental Status: She is alert. Mental status is at baseline.     Motor: No weakness.     Gait: Gait normal.  Psychiatric:        Mood and Affect: Mood normal.        Behavior: Behavior normal.        Thought Content: Thought content normal.      UC Treatments / Results  Labs (all labs ordered are listed, but only abnormal results are displayed) Labs Reviewed  URINALYSIS, W/ REFLEX TO CULTURE (INFECTION SUSPECTED) - Abnormal; Notable for the following components:      Result Value   APPearance HAZY (*)    Hgb urine dipstick TRACE (*)    Nitrite POSITIVE (*)    Leukocytes,Ua SMALL (*)    Bacteria, UA MANY (*)    All other components within normal limits  URINE CULTURE    EKG   Radiology No results found.  Procedures Procedures (including critical care time)  Medications Ordered in UC Medications  cefTRIAXone (ROCEPHIN) injection 1 g (has no administration in time range)    Initial Impression / Assessment and Plan / UC Course  I have reviewed the triage vital signs and the nursing notes.  Pertinent labs & imaging results that were available during my care of the patient were reviewed by me and considered in my medical decision making (see chart for details).   27 year old female presents for fever up to 102.5 degrees, fatigue, low back pain/flank pain x 2 days.  No URI symptoms or UTI symptoms.  Believes she has a UTI given her history of febrile UTIs without typical urinary symptoms.  Current temp 99.3 degrees.  All vitals normal and stable.  She has overall well-appearing.  No acute distress.  On exam has soft abdomen with no tenderness.  Subjective bilateral CVA tenderness.  Chest clear to auscultation heart regular rate and  rhythm.  Urinalysis obtained.  UA shows hazy urine with trace hemoglobin, positive nitrites, small leukocytes, greater than 50 WBCs, many bacteria and clumps of white blood cells.  Urine to be sent for culture.  This consistent with urinary tract infection and with the associated high fevers suspect ascending urinary tract infection.  Discussed with her history of recurrent febrile UTIs and pyelonephritis I would advise Rocephin injection and starting her on Cipro.  She is agreeable to this plan.  Patient given 1 g IM Rocephin in clinic.  Sent Rocephin 500 mg twice daily x 7 days to pharmacy.  Supportive care advised with Tylenol, rest, fluids.  Reviewed return and ER precautions.  Will amend treatment based on culture if necessary.  1 acute illness with systemic symptoms.  Final Clinical Impressions(s) / UC Diagnoses   Final diagnoses:  Acute pyelonephritis  Fever, unspecified  Flank pain     Discharge Instructions      UTI: Based on either symptoms or urinalysis, you may have a urinary tract infection. We will send the urine for culture and call with results in a few days. Begin antibiotics at this time. Your symptoms should be much improved over the next 2-3 days. Increase rest and fluid intake. If for some reason symptoms are worsening or not improving after a couple of days or the  urine culture determines the antibiotics you are taking will not treat the infection, the antibiotics may be changed. Return or go to ER for fever, back pain, worsening urinary pain, discharge, increased blood in urine. May take Tylenol or Motrin OTC for pain relief or consider AZO if no contraindications      ED Prescriptions     Medication Sig Dispense Auth. Provider   ciprofloxacin (CIPRO) 500 MG tablet Take 1 tablet (500 mg total) by mouth every 12 (twelve) hours for 7 days. 14 tablet Gareth Morgan      PDMP not reviewed this encounter.   Shirlee Latch, PA-C 07/28/23 [phone]

## 2023-07-28 NOTE — Discharge Instructions (Addendum)

## 2023-07-30 LAB — URINE CULTURE: Culture: 50000 — AB

## 2023-08-17 ENCOUNTER — Other Ambulatory Visit: Payer: Medicaid Other

## 2023-09-04 ENCOUNTER — Other Ambulatory Visit
Admission: RE | Admit: 2023-09-04 | Discharge: 2023-09-04 | Disposition: A | Discharge status: Home / Self Care | Payer: Medicaid Other | Source: Ambulatory Visit | Attending: Oncology | Admitting: Oncology

## 2023-09-04 ENCOUNTER — Other Ambulatory Visit: Payer: Medicaid Other

## 2023-09-04 DIAGNOSIS — Z006 Encounter for examination for normal comparison and control in clinical research program: Secondary | ICD-10-CM | POA: Insufficient documentation

## 2023-09-11 ENCOUNTER — Other Ambulatory Visit: Payer: Medicaid Other

## 2023-09-13 LAB — GENECONNECT MOLECULAR SCREEN

## 2023-09-13 LAB — HELIX MOLECULAR SCREEN: Genetic Analysis Overall Interpretation: NEGATIVE

## 2023-09-21 ENCOUNTER — Ambulatory Visit
Admission: EM | Admit: 2023-09-21 | Discharge: 2023-09-21 | Disposition: A | Discharge status: Home / Self Care | Payer: Medicaid Other

## 2023-09-21 DIAGNOSIS — J069 Acute upper respiratory infection, unspecified: Secondary | ICD-10-CM

## 2023-09-21 MED ORDER — IPRATROPIUM BROMIDE 0.06 % NA SOLN: 2.0000 | Freq: Four times a day (QID) | NASAL | 12 refills | Status: DC

## 2023-09-21 MED ORDER — BENZONATATE 100 MG PO CAPS: 200.0000 mg | ORAL_CAPSULE | Freq: Three times a day (TID) | ORAL | 0 refills | Status: DC

## 2023-09-21 MED ORDER — PSEUDOEPH-BROMPHEN-DM 30-2-10 MG/5ML PO SYRP: 5.0000 mL | ORAL_SOLUTION | Freq: Four times a day (QID) | ORAL | 0 refills | Status: AC | PRN

## 2023-09-21 MED ORDER — AMOXICILLIN-POT CLAVULANATE 875-125 MG PO TABS: 1.0000 | ORAL_TABLET | Freq: Two times a day (BID) | ORAL | 0 refills | Status: AC

## 2023-09-21 MED ORDER — PSEUDOEPHEDRINE-CODEINE-GG 30-10-100 MG/5ML PO SOLN: 10.0000 mL | Freq: Four times a day (QID) | ORAL | 0 refills | Status: DC | PRN

## 2023-09-21 NOTE — Discharge Instructions (Addendum)
Take the Augmentin 875 twice daily with food for 10 days for treatment of URI.  Use over-the-counter Tylenol and/or ibuprofen according to pack instructions for any fever or pain you may experience.  Increase your oral fluid intake so that you help keep your mucous thin and make it easier for your body to clear.  You may also use over-the-counter Mucinex to break up the adhesiveness of your mucus so is easier for you to clear.  Use the Atrovent nasal spray, 2 squirts in each nostril every 6 hours, as needed for runny nose and postnasal drip.  Use the Tessalon Perles every 8 hours during the day.  Take them with a small sip of water.  They may give you some numbness to the base of your tongue or a metallic taste in your mouth, this is normal.  Use the Cheratussin cough syrup at bedtime for cough and congestion.  This medication will make you drowsy do not take it during the day..  Return for reevaluation or see your primary care provider for any new or worsening symptoms.

## 2023-09-21 NOTE — ED Triage Notes (Signed)
Pt c/o cough and congestion x6days  Pt was around her brother who was sick and had to receive antibiotics.  Pt has been coughing up yellow and green mucus.  Pt states that her brother was diagnosed with a URI and was given amoxicillin and a cough syrup.

## 2023-09-21 NOTE — ED Provider Notes (Addendum)
[facility]    CSN: [id number] Arrival date & time: 09/21/23  1537      History   Chief Complaint Chief Complaint  Patient presents with   Cough   Nasal Congestion    HPI Theresa [name] is a 27 y.o. female.   HPI  27 year old female with past medical history significant for frequent headaches, dysmenorrhea, depression, and anxiety presents for evaluation of 60s worth of respiratory symptoms to include nasal congestion with thick yellow-green nasal discharge, sore throat secondary to coughing, and a cough that is also productive for thick yellow-green mucus.  She denies any fever, ear pain, shortness breath, or wheezing.  Her brother has similar symptoms and was seen earlier this week and given antibiotics.  Past Medical History:  Diagnosis Date   Anxiety    h/o no meds   Complication of anesthesia    Slow to avaken- age 84   Depression    h/o no meds   Dysmenorrhea    Frequent headaches    frequent bad ha'sdue to sinus issues   History of hiatal hernia     Patient Active Problem List   Diagnosis Date Noted   Panniculitis 01/26/2023   Grief reaction 11/06/2016   Generalized anxiety disorder 09/27/2016   Snoring 09/27/2016   Menstrual irregularity 07/10/2016   Obesity 07/10/2016   Cervical cancer screening 07/10/2016   Encounter for immunization 07/10/2016   Encounter for preconception consultation 07/10/2016    Past Surgical History:  Procedure Laterality Date   ABDOMINAL SURGERY     BREAST REDUCTION SURGERY  12/2013   BREAST SURGERY     GASTROPLASTY DUODENAL SWITCH     2020   LIPOSUCTION N/A 05/31/2023   Procedure: LIPOSUCTION;  Surgeon: Peggye Form, DO;  Location: [facility];  Service: Plastics;  Laterality: N/A;   PANNICULECTOMY N/A 05/31/2023   Procedure: PANNICULECTOMY;  Surgeon: Peggye Form, DO;  Location: [facility];  Service: Plastics;  Laterality: N/A;   TONSILLECTOMY Bilateral 01/14/2019   Procedure: TONSILLECTOMY;  Surgeon:  Vernie Murders, MD;  Location: [facility];  Service: ENT;  Laterality: Bilateral;   TUBAL LIGATION  2023   WISDOM TOOTH EXTRACTION      OB History     Gravida  1   Para  0   Term  0   Preterm  0   AB  0   Living  0      SAB  0   IAB  0   Ectopic  0   Multiple  0   Live Births  0            Home Medications    Prior to Admission medications   Medication Sig Start Date End Date Taking? Authorizing Provider  ALTAVERA 0.15-30 MG-MCG tablet Take 1 tablet by mouth every morning. 03/10/22  Yes [provider]  amoxicillin-clavulanate (AUGMENTIN) 875-125 MG tablet Take 1 tablet by mouth every 12 (twelve) hours for 10 days. 09/21/23 10/01/23 Yes Becky Augusta, NP  benzonatate (TESSALON) 100 MG capsule Take 2 capsules (200 mg total) by mouth every 8 (eight) hours. 09/21/23  Yes Becky Augusta, NP  guanFACINE (TENEX) 1 MG tablet Take 1 mg by mouth at bedtime. 09/05/23  Yes [provider]  ipratropium (ATROVENT) 0.06 % nasal spray Place 2 sprays into both nostrils 4 (four) times daily. 09/21/23  Yes Becky Augusta, NP  ondansetron (ZOFRAN) 4 MG tablet Take 1 tablet (4 mg total) by mouth every 8 (eight) hours as needed  for nausea or vomiting. 05/19/23  Yes Hedges, Tinnie Gens, PA-C  pseudoephedrine-codeine-guaifenesin (MYTUSSIN DAC) 30-10-100 MG/5ML solution Take 10 mLs by mouth 4 (four) times daily as needed for cough. 09/21/23  Yes Becky Augusta, NP  sertraline (ZOLOFT) 25 MG tablet Take by mouth. 09/05/23  Yes [provider]  escitalopram (LEXAPRO) 10 MG tablet Take 20 mg by mouth every morning. 05/12/22   [provider]  hydrOXYzine (ATARAX) 25 MG tablet Take 25 mg by mouth 3 (three) times daily as needed for anxiety. 05/17/22   [provider]    Family History Family History  Problem Relation Age of Onset   Depression Mother    Anxiety disorder Mother    Thyroid disease Mother    Diabetes Paternal Grandmother    COPD Paternal  Grandfather     Social History Social History   Tobacco Use   Smoking status: Never    Passive exposure: Never   Smokeless tobacco: Never  Vaping Use   Vaping status: Never Used  Substance Use Topics   Alcohol use: Yes    Alcohol/week: 3.0 standard drinks of alcohol    Types: 3 Cans of beer per week   Drug use: Never     Allergies   Ibuprofen and Promethazine   Review of Systems Review of Systems  Constitutional:  Negative for fever.  HENT:  Positive for congestion, rhinorrhea and sore throat. Negative for ear pain.   Respiratory:  Positive for cough. Negative for shortness of breath and wheezing.      Physical Exam Triage Vital Signs ED Triage Vitals  Encounter Vitals Group     BP 09/21/23 1551 125/72     Systolic BP Percentile --      Diastolic BP Percentile --      Pulse Rate 09/21/23 1551 83     Resp --      Temp 09/21/23 1551 98.3 F (36.8 C)     Temp Source 09/21/23 1551 Oral     SpO2 09/21/23 1551 100 %     Weight 09/21/23 1549 185 lb (83.9 kg)     Height 09/21/23 1549 5\' 2"  (1.575 m)     Head Circumference --      Peak Flow --      Pain Score 09/21/23 1548 4     Pain Loc --      Pain Education --      Exclude from Growth Chart --    No data found.  Updated Vital Signs BP 125/72 (BP Location: Left Arm)   Pulse 83   Temp 98.3 F (36.8 C) (Oral)   Ht 5\' 2"  (1.575 m)   Wt 185 lb (83.9 kg)   LMP 09/15/2023   SpO2 100%   BMI 33.84 kg/m   Visual Acuity Right Eye Distance:   Left Eye Distance:   Bilateral Distance:    Right Eye Near:   Left Eye Near:    Bilateral Near:     Physical Exam Vitals and nursing note reviewed.  Constitutional:      Appearance: Normal appearance. She is not ill-appearing.  HENT:     Head: Normocephalic and atraumatic.     Right Ear: Tympanic membrane, ear canal and external ear normal. There is no impacted cerumen.     Left Ear: Tympanic membrane, ear canal and external ear normal. There is no impacted  cerumen.     Nose: Congestion and rhinorrhea present.     Comments: Patient mucosa is erythematous and edematous  with yellow discharge in both nares.  Patient does have tenderness to compression of maxillary sinuses bilaterally.    Mouth/Throat:     Mouth: Mucous membranes are moist.     Pharynx: Oropharynx is clear. No oropharyngeal exudate or posterior oropharyngeal erythema.  Cardiovascular:     Rate and Rhythm: Normal rate and regular rhythm.     Pulses: Normal pulses.     Heart sounds: Normal heart sounds. No murmur heard.    No friction rub. No gallop.  Pulmonary:     Effort: Pulmonary effort is normal.     Breath sounds: Normal breath sounds. No wheezing, rhonchi or rales.  Musculoskeletal:     Cervical back: Normal range of motion and neck supple. No tenderness.  Lymphadenopathy:     Cervical: No cervical adenopathy.  Skin:    General: Skin is warm and dry.     Capillary Refill: Capillary refill takes less than 2 seconds.     Findings: No rash.  Neurological:     General: No focal deficit present.     Mental Status: She is alert and oriented to person, place, and time.      UC Treatments / Results  Labs (all labs ordered are listed, but only abnormal results are displayed) Labs Reviewed - No data to display  EKG   Radiology No results found.  Procedures Procedures (including critical care time)  Medications Ordered in UC Medications - No data to display  Initial Impression / Assessment and Plan / UC Course  I have reviewed the triage vital signs and the nursing notes.  Pertinent labs & imaging results that were available during my care of the patient were reviewed by me and considered in my medical decision making (see chart for details).   Patient is a pleasant, nontoxic-appearing 28 year old female presenting for evaluation of 1 week worth of respiratory symptoms as outlined HPI above.  She does have thick yellow nasal discharge and tenderness to  compression of bilateral maxillary sinuses.  Her cardiopulmonary exam is benign.  Her exam is consistent with an upper respiratory infection.  I will discharge her home on Augmentin 875 twice daily for 10 days for treatment of her URI along with Atrovent nasal spray top with nasal congestion and postnasal drip.  Tessalon Perles for cough during the day.  I will discharge her home on Cheratussin to use at nighttime as she has an allergy to promethazine.  Patient called to explain that her insurance would not cover the Cheratussin and that [facility] is also out of this medication.  I have sent a new prescription for Bromfed-DM to the pharmacy for patient to use as needed for cough and cold.   Final Clinical Impressions(s) / UC Diagnoses   Final diagnoses:  URI with cough and congestion     Discharge Instructions      Take the Augmentin 875 twice daily with food for 10 days for treatment of URI.  Use over-the-counter Tylenol and/or ibuprofen according to pack instructions for any fever or pain you may experience.  Increase your oral fluid intake so that you help keep your mucous thin and make it easier for your body to clear.  You may also use over-the-counter Mucinex to break up the adhesiveness of your mucus so is easier for you to clear.  Use the Atrovent nasal spray, 2 squirts in each nostril every 6 hours, as needed for runny nose and postnasal drip.  Use the Tessalon Perles every 8 hours during the  day.  Take them with a small sip of water.  They may give you some numbness to the base of your tongue or a metallic taste in your mouth, this is normal.  Use the Cheratussin cough syrup at bedtime for cough and congestion.  This medication will make you drowsy do not take it during the day..  Return for reevaluation or see your primary care provider for any new or worsening symptoms.      ED Prescriptions     Medication Sig Dispense Auth. Provider   amoxicillin-clavulanate (AUGMENTIN)  875-125 MG tablet Take 1 tablet by mouth every 12 (twelve) hours for 10 days. 20 tablet Becky Augusta, NP   benzonatate (TESSALON) 100 MG capsule Take 2 capsules (200 mg total) by mouth every 8 (eight) hours. 21 capsule Becky Augusta, NP   ipratropium (ATROVENT) 0.06 % nasal spray Place 2 sprays into both nostrils 4 (four) times daily. 15 mL Becky Augusta, NP   pseudoephedrine-codeine-guaifenesin (MYTUSSIN DAC) 30-10-100 MG/5ML solution Take 10 mLs by mouth 4 (four) times daily as needed for cough. 120 mL Becky Augusta, NP      I have reviewed the PDMP during this encounter.   Becky Augusta, NP 09/21/23 1607    Becky Augusta, NP 09/21/23 1608    Becky Augusta, NP 09/21/23 1729

## 2023-10-01 ENCOUNTER — Telehealth: Payer: Medicaid Other | Admitting: Physician Assistant

## 2023-10-01 DIAGNOSIS — J069 Acute upper respiratory infection, unspecified: Secondary | ICD-10-CM

## 2023-10-01 MED ORDER — CETIRIZINE-PSEUDOEPHEDRINE ER 5-120 MG PO TB12: 1.0000 | ORAL_TABLET | Freq: Two times a day (BID) | ORAL | 0 refills | Status: AC

## 2023-10-01 MED ORDER — AZELASTINE HCL 0.1 % NA SOLN: 1.0000 | Freq: Two times a day (BID) | NASAL | 0 refills | Status: AC

## 2023-10-01 NOTE — Progress Notes (Signed)
Virtual Visit Consent   Theresa [name], you are scheduled for a virtual visit with a [facility] provider today. Just as with appointments in the office, your consent must be obtained to participate. Your consent will be active for this visit and any virtual visit you may have with one of our providers in the next 365 days. If you have a MyChart account, a copy of this consent can be sent to you electronically.  As this is a virtual visit, video technology does not allow for your provider to perform a traditional examination. This may limit your provider's ability to fully assess your condition. If your provider identifies any concerns that need to be evaluated in person or the need to arrange testing (such as labs, EKG, etc.), we will make arrangements to do so. Although advances in technology are sophisticated, we cannot ensure that it will always work on either your end or our end. If the connection with a video visit is poor, the visit may have to be switched to a telephone visit. With either a video or telephone visit, we are not always able to ensure that we have a secure connection.  By engaging in this virtual visit, you consent to the provision of healthcare and authorize for your insurance to be billed (if applicable) for the services provided during this visit. Depending on your insurance coverage, you may receive a charge related to this service.  I need to obtain your verbal consent now. Are you willing to proceed with your visit today? Theresa [name] has provided verbal consent on 10/01/2023 for a virtual visit (video or telephone). Theresa [name], [place]  Date: 10/01/2023 3:43 PM  Virtual Visit via Video Note   I, Theresa [name], connected with  Theresa [name]  ([id number], [birth year]) on 10/01/23 at  3:30 PM EST by a video-enabled telemedicine application and verified that I am speaking with the correct person using two identifiers.  Location: Patient: Virtual Visit Location Patient:  Home Provider: Virtual Visit Location Provider: Home Office   I discussed the limitations of evaluation and management by telemedicine and the availability of in person appointments. The patient expressed understanding and agreed to proceed.    History of Present Illness: Theresa [name] is a 27 y.o. who identifies as a female who was assigned female at birth, and is being seen today for URI.  HPI: URI  This is a new problem. The current episode started in the past 7 days. The problem has been gradually worsening. There has been no fever. Associated symptoms include congestion, sinus pain and a sore throat. Pertinent negatives include no chest pain, coughing, diarrhea, dysuria, ear pain, joint pain, joint swelling, nausea, neck pain, plugged ear sensation, rash, sneezing, swollen glands, vomiting or wheezing. Treatments tried: abx. The treatment provided mild relief.    Problems:  Patient Active Problem List   Diagnosis Date Noted   Panniculitis 01/26/2023   Grief reaction 11/06/2016   Generalized anxiety disorder 09/27/2016   Snoring 09/27/2016   Menstrual irregularity 07/10/2016   Obesity 07/10/2016   Cervical cancer screening 07/10/2016   Encounter for immunization 07/10/2016   Encounter for preconception consultation 07/10/2016    Allergies:  Allergies  Allergen Reactions   Ibuprofen Other (See Comments)    Bariatric surgery.   Promethazine Nausea And Vomiting   Medications:  Current Outpatient Medications:    azelastine (ASTELIN) 0.1 % nasal spray, Place 1 spray into both nostrils 2 (two) times daily for 7 days. Use in each nostril as directed,  Disp: 2.1 mL, Rfl: 0   cetirizine-pseudoephedrine (ZYRTEC-D) 5-120 MG tablet, Take 1 tablet by mouth 2 (two) times daily for 5 days., Disp: 10 tablet, Rfl: 0   ALTAVERA 0.15-30 MG-MCG tablet, Take 1 tablet by mouth every morning., Disp: , Rfl:    amoxicillin-clavulanate (AUGMENTIN) 875-125 MG tablet, Take 1 tablet by mouth every 12  (twelve) hours for 10 days., Disp: 20 tablet, Rfl: 0   benzonatate (TESSALON) 100 MG capsule, Take 2 capsules (200 mg total) by mouth every 8 (eight) hours., Disp: 21 capsule, Rfl: 0   brompheniramine-pseudoephedrine-DM 30-2-10 MG/5ML syrup, Take 5 mLs by mouth 4 (four) times daily as needed., Disp: 120 mL, Rfl: 0   escitalopram (LEXAPRO) 10 MG tablet, Take 20 mg by mouth every morning., Disp: , Rfl:    guanFACINE (TENEX) 1 MG tablet, Take 1 mg by mouth at bedtime., Disp: , Rfl:    hydrOXYzine (ATARAX) 25 MG tablet, Take 25 mg by mouth 3 (three) times daily as needed for anxiety., Disp: , Rfl:    ipratropium (ATROVENT) 0.06 % nasal spray, Place 2 sprays into both nostrils 4 (four) times daily., Disp: 15 mL, Rfl: 12   ondansetron (ZOFRAN) 4 MG tablet, Take 1 tablet (4 mg total) by mouth every 8 (eight) hours as needed for nausea or vomiting., Disp: 20 tablet, Rfl: 0   sertraline (ZOLOFT) 25 MG tablet, Take by mouth., Disp: , Rfl:   Observations/Objective: Patient is well-developed, well-nourished in no acute distress.  Resting comfortably  at home.  Head is normocephalic, atraumatic.  No labored breathing.  Speech is clear and coherent with logical content.  Patient is alert and oriented at baseline.    Assessment and Plan: 1. Upper respiratory tract infection, unspecified type  patient presents with symptoms suspicious for likely viral upper respiratory infection. Differential includes bacterial pneumonia, sinusitis, allergic rhinitis, Do not suspect underlying cardiopulmonary process. I considered, but think unlikely, dangerous causes of this patient's symptoms to include ACS, CHF or COPD exacerbations, pneumonia, pneumothorax. Patient is nontoxic appearing and not in need of emergent medical intervention.  Plan: reassurance, reassessment, over the counter medications, discharge with PCP followup  Follow Up Instructions: I discussed the assessment and treatment plan with the patient. The  patient was provided an opportunity to ask questions and all were answered. The patient agreed with the plan and demonstrated an understanding of the instructions.  A copy of instructions were sent to the patient via MyChart unless otherwise noted below.    The patient was advised to call back or seek an in-person evaluation if the symptoms worsen or if the condition fails to improve as anticipated.    Theresa Kidney, PA-C

## 2023-10-01 NOTE — Patient Instructions (Signed)
Kathrin Penner, thank you for joining [name], PA-C for today's virtual visit.  While this provider is not your primary care provider (PCP), if your PCP is located in our provider database this encounter information will be shared with them immediately following your visit.   A [facility] MyChart account gives you access to today's visit and all your visits, tests, and labs performed at [facility] " click here if you don't have a [facility] MyChart account or go to [email or web address]  Consent: (Patient) Asante Feltman provided verbal consent for this virtual visit at the beginning of the encounter.  Current Medications:  Current Outpatient Medications:    azelastine (ASTELIN) 0.1 % nasal spray, Place 1 spray into both nostrils 2 (two) times daily for 7 days. Use in each nostril as directed, Disp: 2.1 mL, Rfl: 0   cetirizine-pseudoephedrine (ZYRTEC-D) 5-120 MG tablet, Take 1 tablet by mouth 2 (two) times daily for 5 days., Disp: 10 tablet, Rfl: 0   ALTAVERA 0.15-30 MG-MCG tablet, Take 1 tablet by mouth every morning., Disp: , Rfl:    amoxicillin-clavulanate (AUGMENTIN) 875-125 MG tablet, Take 1 tablet by mouth every 12 (twelve) hours for 10 days., Disp: 20 tablet, Rfl: 0   benzonatate (TESSALON) 100 MG capsule, Take 2 capsules (200 mg total) by mouth every 8 (eight) hours., Disp: 21 capsule, Rfl: 0   brompheniramine-pseudoephedrine-DM 30-2-10 MG/5ML syrup, Take 5 mLs by mouth 4 (four) times daily as needed., Disp: 120 mL, Rfl: 0   escitalopram (LEXAPRO) 10 MG tablet, Take 20 mg by mouth every morning., Disp: , Rfl:    guanFACINE (TENEX) 1 MG tablet, Take 1 mg by mouth at bedtime., Disp: , Rfl:    hydrOXYzine (ATARAX) 25 MG tablet, Take 25 mg by mouth 3 (three) times daily as needed for anxiety., Disp: , Rfl:    ipratropium (ATROVENT) 0.06 % nasal spray, Place 2 sprays into both nostrils 4 (four) times daily., Disp: 15 mL, Rfl: 12   ondansetron (ZOFRAN) 4 MG tablet, Take  1 tablet (4 mg total) by mouth every 8 (eight) hours as needed for nausea or vomiting., Disp: 20 tablet, Rfl: 0   sertraline (ZOLOFT) 25 MG tablet, Take by mouth., Disp: , Rfl:    Medications ordered in this encounter:  Meds ordered this encounter  Medications   azelastine (ASTELIN) 0.1 % nasal spray    Sig: Place 1 spray into both nostrils 2 (two) times daily for 7 days. Use in each nostril as directed    Dispense:  2.1 mL    Refill:  0    Order Specific Question:   Supervising Provider    Answer:   Merrilee Jansky [[id number]]   cetirizine-pseudoephedrine (ZYRTEC-D) 5-120 MG tablet    Sig: Take 1 tablet by mouth 2 (two) times daily for 5 days.    Dispense:  10 tablet    Refill:  0    Order Specific Question:   Supervising Provider    Answer:   Merrilee Jansky [id number]     *If you need refills on other medications prior to your next appointment, please contact your pharmacy*  Follow-Up: Call back or seek an in-person evaluation if the symptoms worsen or if the condition fails to improve as anticipated.  [facility] [phone]  Other Instructions Follow up with PCP in 2 -3 days if symptoms are not improving.    If you have been instructed to have an in-person evaluation today at a local Urgent Care  facility, please use the link below. It will take you to a list of all of our available [facility], including address, phone number and hours of operation. Please do not delay care.  [facility]  If you or a family member do not have a primary care provider, use the link below to schedule a visit and establish care. When you choose a [facility] primary care physician or advanced practice provider, you gain a long-term partner in health. Find a Primary Care Provider  Learn more about [facility]'s in-office and virtual care options: [facility] Get Care Now

## 2023-10-03 ENCOUNTER — Ambulatory Visit
Admission: EM | Admit: 2023-10-03 | Discharge: 2023-10-03 | Disposition: A | Discharge status: Home / Self Care | Payer: Medicaid Other | Attending: Emergency Medicine | Admitting: Emergency Medicine

## 2023-10-03 DIAGNOSIS — J069 Acute upper respiratory infection, unspecified: Secondary | ICD-10-CM | POA: Diagnosis present

## 2023-10-03 DIAGNOSIS — Z1152 Encounter for screening for COVID-19: Secondary | ICD-10-CM | POA: Diagnosis not present

## 2023-10-03 DIAGNOSIS — Z79899 Other long term (current) drug therapy: Secondary | ICD-10-CM | POA: Insufficient documentation

## 2023-10-03 LAB — RESP PANEL BY RT-PCR (FLU A&B, COVID) ARPGX2
Influenza A by PCR: NEGATIVE
Influenza B by PCR: NEGATIVE
SARS Coronavirus 2 by RT PCR: NEGATIVE

## 2023-10-03 MED ORDER — AZITHROMYCIN 250 MG PO TABS: ORAL_TABLET | ORAL | 0 refills | Status: AC

## 2023-10-03 NOTE — ED Provider Notes (Signed)
[facility]    CSN: [id number] Arrival date & time: 10/03/23  1650      History   Chief Complaint Chief Complaint  Patient presents with   Cough   Congestion    HPI Theresa [name] is a 27 y.o. female.   27 year old female, Theresa [name], presents to urgent care for evaluation of cough and congestion for 2 weeks.  Patient states she was recently seen on 09/21/2023 for same issue and was given phenergan cough med, amoxicillin, Atrovent, and Tessalon without relief, had an e-visit yesterday was given Zyrtec and Astelin, pt feels worse.  The history is provided by the patient. No language interpreter was used.    Past Medical History:  Diagnosis Date   Anxiety    h/o no meds   Complication of anesthesia    Slow to avaken- age 62   Depression    h/o no meds   Dysmenorrhea    Frequent headaches    frequent bad ha'sdue to sinus issues   History of hiatal hernia     Patient Active Problem List   Diagnosis Date Noted   Upper respiratory tract infection 10/03/2023   Panniculitis 01/26/2023   Grief reaction 11/06/2016   Generalized anxiety disorder 09/27/2016   Snoring 09/27/2016   Menstrual irregularity 07/10/2016   Obesity 07/10/2016   Cervical cancer screening 07/10/2016   Encounter for immunization 07/10/2016   Encounter for preconception consultation 07/10/2016    Past Surgical History:  Procedure Laterality Date   ABDOMINAL SURGERY     BREAST REDUCTION SURGERY  12/2013   BREAST SURGERY     GASTROPLASTY DUODENAL SWITCH     2020   LIPOSUCTION N/A 05/31/2023   Procedure: LIPOSUCTION;  Surgeon: Peggye Form, DO;  Location: [facility] OR;  Service: Plastics;  Laterality: N/A;   PANNICULECTOMY N/A 05/31/2023   Procedure: PANNICULECTOMY;  Surgeon: Peggye Form, DO;  Location: [facility] OR;  Service: Plastics;  Laterality: N/A;   TONSILLECTOMY Bilateral 01/14/2019   Procedure: TONSILLECTOMY;  Surgeon: Vernie Murders, MD;  Location: [facility] ORS;  Service:  ENT;  Laterality: Bilateral;   TUBAL LIGATION  2023   WISDOM TOOTH EXTRACTION      OB History     Gravida  1   Para  0   Term  0   Preterm  0   AB  0   Living  0      SAB  0   IAB  0   Ectopic  0   Multiple  0   Live Births  0            Home Medications    Prior to Admission medications   Medication Sig Start Date End Date Taking? Authorizing Provider  ALTAVERA 0.15-30 MG-MCG tablet Take 1 tablet by mouth every morning. 03/10/22  Yes [provider]  atomoxetine (STRATTERA) 25 MG capsule Take 25 mg by mouth daily. 09/22/23  Yes [provider]  azelastine (ASTELIN) 0.1 % nasal spray Place 1 spray into both nostrils 2 (two) times daily for 7 days. Use in each nostril as directed 10/01/23 10/08/23 Yes McLean, Darius, PA-C  azithromycin (ZITHROMAX Z-PAK) 250 MG tablet Take as package directed 10/03/23  Yes Leemon Ayala, Para March, NP  cetirizine-pseudoephedrine (ZYRTEC-D) 5-120 MG tablet Take 1 tablet by mouth 2 (two) times daily for 5 days. 10/01/23 10/06/23 Yes McLean, Darius, PA-C  ipratropium (ATROVENT) 0.06 % nasal spray Place 2 sprays into both nostrils 4 (four) times daily. 09/21/23  Yes Ryan,  Riki Rusk, NP  ondansetron (ZOFRAN) 4 MG tablet Take 1 tablet (4 mg total) by mouth every 8 (eight) hours as needed for nausea or vomiting. 05/19/23  Yes Hedges, Tinnie Gens, PA-C  sertraline (ZOLOFT) 25 MG tablet Take by mouth. 09/05/23  Yes [provider]  benzonatate (TESSALON) 100 MG capsule Take 2 capsules (200 mg total) by mouth every 8 (eight) hours. 09/21/23   Becky Augusta, NP  brompheniramine-pseudoephedrine-DM 30-2-10 MG/5ML syrup Take 5 mLs by mouth 4 (four) times daily as needed. 09/21/23   Becky Augusta, NP  escitalopram (LEXAPRO) 10 MG tablet Take 20 mg by mouth every morning. 05/12/22   [provider]  guanFACINE (TENEX) 1 MG tablet Take 1 mg by mouth at bedtime. 09/05/23   [provider]  hydrOXYzine (ATARAX) 25 MG tablet Take 25  mg by mouth 3 (three) times daily as needed for anxiety. 05/17/22   [provider]    Family History Family History  Problem Relation Age of Onset   Depression Mother    Anxiety disorder Mother    Thyroid disease Mother    Diabetes Paternal Grandmother    COPD Paternal Grandfather     Social History Social History   Tobacco Use   Smoking status: Never    Passive exposure: Never   Smokeless tobacco: Never  Vaping Use   Vaping status: Never Used  Substance Use Topics   Alcohol use: Yes    Alcohol/week: 3.0 standard drinks of alcohol    Types: 3 Cans of beer per week   Drug use: Never     Allergies   Ibuprofen and Promethazine   Review of Systems Review of Systems  Constitutional:  Positive for fever. Negative for chills.  HENT:  Positive for congestion and sore throat. Negative for ear pain and rhinorrhea.   Eyes: Negative.   Respiratory:  Positive for cough.   Gastrointestinal:  Negative for nausea and vomiting.  Endocrine: Negative.   Genitourinary:  Negative for dysuria.  Musculoskeletal:  Negative for myalgias.  Skin:  Negative for rash.  Allergic/Immunologic: Negative.   Neurological:  Negative for headaches.  Hematological: Negative.   Psychiatric/Behavioral: Negative.    All other systems reviewed and are negative.    Physical Exam Triage Vital Signs ED Triage Vitals  Encounter Vitals Group     BP 10/03/23 1741 126/87     Systolic BP Percentile --      Diastolic BP Percentile --      Pulse Rate 10/03/23 1741 80     Resp 10/03/23 1741 16     Temp 10/03/23 1741 99.3 F (37.4 C)     Temp Source 10/03/23 1741 Oral     SpO2 10/03/23 1741 97 %     Weight 10/03/23 1739 185 lb (83.9 kg)     Height 10/03/23 1739 5\' 2"  (1.575 m)     Head Circumference --      Peak Flow --      Pain Score 10/03/23 1743 0     Pain Loc --      Pain Education --      Exclude from Growth Chart --    No data found.  Updated Vital Signs BP 126/87 (BP  Location: Left Arm)   Pulse 80   Temp 99.3 F (37.4 C) (Oral)   Resp 16   Ht 5\' 2"  (1.575 m)   Wt 185 lb (83.9 kg)   LMP 09/15/2023   SpO2 97%   BMI 33.84 kg/m  Visual Acuity Right Eye Distance:   Left Eye Distance:   Bilateral Distance:    Right Eye Near:   Left Eye Near:    Bilateral Near:     Physical Exam Vitals and nursing note reviewed.  Constitutional:      General: She is not in acute distress.    Appearance: She is well-developed.  HENT:     Head: Normocephalic.     Right Ear: Tympanic membrane is retracted.     Left Ear: Tympanic membrane is retracted.     Nose: Congestion present.     Mouth/Throat:     Lips: Pink.     Mouth: Mucous membranes are moist.     Pharynx: Oropharynx is clear.  Eyes:     General: Lids are normal.     Conjunctiva/sclera: Conjunctivae normal.     Pupils: Pupils are equal, round, and reactive to light.  Neck:     Trachea: No tracheal deviation.  Cardiovascular:     Rate and Rhythm: Regular rhythm.     Pulses: Normal pulses.     Heart sounds: Normal heart sounds. No murmur heard. Pulmonary:     Effort: Pulmonary effort is normal.     Breath sounds: Normal breath sounds.  Abdominal:     General: Bowel sounds are normal.     Palpations: Abdomen is soft.     Tenderness: There is no abdominal tenderness.  Musculoskeletal:        General: Normal range of motion.     Cervical back: Normal range of motion.  Lymphadenopathy:     Cervical: No cervical adenopathy.  Skin:    General: Skin is warm and dry.     Findings: No rash.  Neurological:     General: No focal deficit present.     Mental Status: She is alert and oriented to person, place, and time.     GCS: GCS eye subscore is 4. GCS verbal subscore is 5. GCS motor subscore is 6.  Psychiatric:        Speech: Speech normal.        Behavior: Behavior normal. Behavior is cooperative.      UC Treatments / Results  Labs (all labs ordered are listed, but only abnormal  results are displayed) Labs Reviewed  RESP PANEL BY RT-PCR (FLU A&B, COVID) [id number]    EKG   Radiology No results found.  Procedures Procedures (including critical care time)  Medications Ordered in UC Medications - No data to display  Initial Impression / Assessment and Plan / UC Course  I have reviewed the triage vital signs and the nursing notes.  Pertinent labs & imaging results that were available during my care of the patient were reviewed by me and considered in my medical decision making (see chart for details).    Discussed exam findings and plan of care with patient, strict go to ER precautions given.   Patient verbalized understanding to this provider.  Ddx: Acute URI, viral illness, allergies Final Clinical Impressions(s) / UC Diagnoses   Final diagnoses:  Upper respiratory tract infection, unspecified type     Discharge Instructions      Check my chart for results. Rest,push fluids, follow up with PCP. Take z pak as directed.     ED Prescriptions     Medication Sig Dispense Auth. Provider   azithromycin (ZITHROMAX Z-PAK) 250 MG tablet Take as package directed 6 tablet Carlye Panameno, Para March, NP      PDMP not reviewed this encounter.  Clancy Gourd, NP 10/03/23 [phone]

## 2023-10-03 NOTE — ED Triage Notes (Signed)
Pt c/o cough & congestion x2 wks. Was seen on 11/21 for the same issue was given promethazine,amoxi,atrovent & tessalon w/o relief. Had E-visit yesterday & was given zyrtec & azelastine.

## 2023-10-03 NOTE — Discharge Instructions (Addendum)
Check my chart for results. Rest,push fluids, follow up with PCP. Take z pak as directed.

## 2024-03-07 ENCOUNTER — Ambulatory Visit
Admission: EM | Admit: 2024-03-07 | Discharge: 2024-03-07 | Disposition: A | Discharge status: Home / Self Care | Attending: Emergency Medicine | Admitting: Emergency Medicine

## 2024-03-07 ENCOUNTER — Encounter: Payer: Self-pay | Admitting: Emergency Medicine

## 2024-03-07 DIAGNOSIS — J069 Acute upper respiratory infection, unspecified: Secondary | ICD-10-CM | POA: Diagnosis present

## 2024-03-07 LAB — GROUP A STREP BY PCR: Group A Strep by PCR: NOT DETECTED

## 2024-03-07 MED ORDER — BENZONATATE 100 MG PO CAPS: 200.0000 mg | ORAL_CAPSULE | Freq: Three times a day (TID) | ORAL | 0 refills | Status: DC

## 2024-03-07 MED ORDER — IPRATROPIUM BROMIDE 0.06 % NA SOLN: 2.0000 | Freq: Four times a day (QID) | NASAL | 12 refills | Status: DC

## 2024-03-07 NOTE — ED Provider Notes (Signed)
 [facility]    CSN: [id number] Arrival date & time: 03/07/24  1850      History   Chief Complaint Chief Complaint  Patient presents with   Sore Throat    HPI Theresa [name] is a 28 y.o. female.   HPI  28 year old female with past medical history significant for depression and anxiety presents for evaluation of nasal congestion with postnasal drip, loss of voice, red throat, and white patches in the back of her throat that she first noticed yesterday.  She reports that her mom has similar symptoms started several days earlier.  She denies any fever and reports an infrequent, nonproductive cough.  Past Medical History:  Diagnosis Date   Anxiety    h/o no meds   Complication of anesthesia    Slow to avaken- age 2   Depression    h/o no meds   Dysmenorrhea    Frequent headaches    frequent bad ha'sdue to sinus issues   History of hiatal hernia     Patient Active Problem List   Diagnosis Date Noted   Upper respiratory tract infection 10/03/2023   Panniculitis 01/26/2023   Grief reaction 11/06/2016   Generalized anxiety disorder 09/27/2016   Snoring 09/27/2016   Menstrual irregularity 07/10/2016   Obesity 07/10/2016   Cervical cancer screening 07/10/2016   Encounter for immunization 07/10/2016   Encounter for preconception consultation 07/10/2016    Past Surgical History:  Procedure Laterality Date   ABDOMINAL SURGERY     BREAST REDUCTION SURGERY  12/2013   BREAST SURGERY     GASTROPLASTY DUODENAL SWITCH     2020   LIPOSUCTION N/A 05/31/2023   Procedure: LIPOSUCTION;  Surgeon: Thornell Flirt, DO;  Location: [facility];  Service: Plastics;  Laterality: N/A;   PANNICULECTOMY N/A 05/31/2023   Procedure: PANNICULECTOMY;  Surgeon: Thornell Flirt, DO;  Location: [facility] OR;  Service: Plastics;  Laterality: N/A;   TONSILLECTOMY Bilateral 01/14/2019   Procedure: TONSILLECTOMY;  Surgeon: Mellody Sprout, MD;  Location: [facility];  Service: ENT;  Laterality:  Bilateral;   TUBAL LIGATION  2023   WISDOM TOOTH EXTRACTION      OB History     Gravida  1   Para  0   Term  0   Preterm  0   AB  0   Living  0      SAB  0   IAB  0   Ectopic  0   Multiple  0   Live Births  0            Home Medications    Prior to Admission medications   Medication Sig Start Date End Date Taking? Authorizing Provider  benzonatate  (TESSALON ) 100 MG capsule Take 2 capsules (200 mg total) by mouth every 8 (eight) hours. 03/07/24  Yes Kent Pear, NP  ipratropium (ATROVENT ) 0.06 % nasal spray Place 2 sprays into both nostrils 4 (four) times daily. 03/07/24  Yes Kent Pear, NP  levonorgestrel (LILETTA) 20.1 MCG/DAY IUD IUD 1 each by Intrauterine route once.   Yes [provider]  ALTAVERA 0.15-30 MG-MCG tablet Take 1 tablet by mouth every morning. 03/10/22   [provider]  atomoxetine (STRATTERA) 25 MG capsule Take 25 mg by mouth daily. 09/22/23  Yes [provider]  azelastine  (ASTELIN ) 0.1 % nasal spray Place 1 spray into both nostrils 2 (two) times daily for 7 days. Use in each nostril as directed 10/01/23 10/08/23  Marciana Settle, PA-C  azithromycin  (ZITHROMAX  Z-PAK) 250 MG tablet Take as package directed 10/03/23   Defelice, Eveleen Hinds, NP  brompheniramine-pseudoephedrine -DM 30-2-10 MG/5ML syrup Take 5 mLs by mouth 4 (four) times daily as needed. 09/21/23   Kent Pear, NP  escitalopram (LEXAPRO) 10 MG tablet Take 20 mg by mouth every morning. 05/12/22   [provider]  guanFACINE (TENEX) 1 MG tablet Take 1 mg by mouth at bedtime. 09/05/23   [provider]  hydrOXYzine (ATARAX) 25 MG tablet Take 25 mg by mouth 3 (three) times daily as needed for anxiety. 05/17/22   [provider]  ondansetron  (ZOFRAN ) 4 MG tablet Take 1 tablet (4 mg total) by mouth every 8 (eight) hours as needed for nausea or vomiting. 05/19/23  Yes Hedges, Susana Enter, PA-C  sertraline  (ZOLOFT ) 25 MG tablet Take by mouth. 09/05/23   Yes [provider]    Family History Family History  Problem Relation Age of Onset   Depression Mother    Anxiety disorder Mother    Thyroid  disease Mother    Diabetes Paternal Grandmother    COPD Paternal Grandfather     Social History Social History   Tobacco Use   Smoking status: Never    Passive exposure: Never   Smokeless tobacco: Never  Vaping Use   Vaping status: Never Used  Substance Use Topics   Alcohol use: Yes    Alcohol/week: 3.0 standard drinks of alcohol    Types: 3 Cans of beer per week   Drug use: Never     Allergies   Ibuprofen  and Promethazine   Review of Systems Review of Systems  Constitutional:  Negative for fever.  HENT:  Positive for congestion, postnasal drip, rhinorrhea, sore throat and voice change. Negative for ear pain.   Respiratory:  Positive for cough. Negative for shortness of breath and wheezing.      Physical Exam Triage Vital Signs ED Triage Vitals  Encounter Vitals Group     BP      Systolic BP Percentile      Diastolic BP Percentile      Pulse      Resp      Temp      Temp src      SpO2      Weight      Height      Head Circumference      Peak Flow      Pain Score      Pain Loc      Pain Education      Exclude from Growth Chart    No data found.  Updated Vital Signs BP 117/77 (BP Location: Left Arm)   Pulse 92   Temp 98.3 F (36.8 C) (Oral)   Ht 5\' 2"  (1.575 m)   Wt 163 lb (73.9 kg)   LMP 02/15/2024   SpO2 99%   BMI 29.81 kg/m   Visual Acuity Right Eye Distance:   Left Eye Distance:   Bilateral Distance:    Right Eye Near:   Left Eye Near:    Bilateral Near:     Physical Exam Vitals and nursing note reviewed.  Constitutional:      Appearance: Normal appearance. She is not ill-appearing.  HENT:     Head: Normocephalic and atraumatic.     Right Ear: Tympanic membrane, ear canal and external ear normal. There is no impacted cerumen.     Left Ear: Tympanic membrane, ear canal and  external ear normal. There is no impacted cerumen.  Nose: Congestion and rhinorrhea present.     Comments: This mucosa is edematous and erythematous with scant clear discharge in both nares.    Mouth/Throat:     Mouth: Mucous membranes are moist.     Pharynx: Oropharynx is clear. Posterior oropharyngeal erythema present. No oropharyngeal exudate.     Comments: Tonsillar pillars are surgically absent.  Posterior oropharynx demonstrates erythema and injection.  No exudate noted. Cardiovascular:     Rate and Rhythm: Normal rate and regular rhythm.     Pulses: Normal pulses.     Heart sounds: Normal heart sounds. No murmur heard.    No friction rub. No gallop.  Pulmonary:     Effort: Pulmonary effort is normal.     Breath sounds: Normal breath sounds. No wheezing, rhonchi or rales.  Musculoskeletal:     Cervical back: Normal range of motion and neck supple. No tenderness.  Lymphadenopathy:     Cervical: No cervical adenopathy.  Skin:    General: Skin is warm and dry.     Capillary Refill: Capillary refill takes less than 2 seconds.     Findings: No rash.  Neurological:     General: No focal deficit present.     Mental Status: She is alert and oriented to person, place, and time.      UC Treatments / Results  Labs (all labs ordered are listed, but only abnormal results are displayed) Labs Reviewed  GROUP A STREP BY PCR    EKG   Radiology No results found.  Procedures Procedures (including critical care time)  Medications Ordered in UC Medications - No data to display  Initial Impression / Assessment and Plan / UC Course  I have reviewed the triage vital signs and the nursing notes.  Pertinent labs & imaging results that were available during my care of the patient were reviewed by me and considered in my medical decision making (see chart for details).   Patient is a pleasant, nontoxic-appearing 28 year old female pending for evaluation of respiratory symptoms as  outlined HPI above.  Her most significant symptom is a sore throat and she describes the pain as feeling as though she is swallowing glass.  She is also experiencing a loss of voice and sounds very hoarse in the exam room.  She does have some nasal congestion and postnasal drip but no significant rhinorrhea.  Also infrequent, nonproductive cough.  I will order a strep PCR.  Strep PCR is negative.  I will discharge patient with a diagnosis of viral URI with a cough.  I will prescribe Atrovent  nasal spray to help with nasal congestion.  Tessalon  Perles for cough and congestion.  She may also use over-the-counter cough medicine of her choice to help with the cough symptoms.  She may use over-the-counter Chloraseptic or Sucrets lozenges to help soothe her throat along with salt water gargles.   Final Clinical Impressions(s) / UC Diagnoses   Final diagnoses:  Viral URI with cough     Discharge Instructions      Your strep test today is negative.  I do believe you have a respiratory virus which is causing your symptoms.  To help with your laryngitis I recommend voice rest.  The last you strain your voice last inflammation is present and the past your voice will return.  Sip on cool or warm beverages, whichever feels better.  You may also use over-the-counter Chloraseptic or Sucrets lozenges to help soothe your throat.  No more than 1 lozenge every 2  hours as the menthol may give you diarrhea.  Salt water gargles can also be helpful to limit drainage in the back of your throat and soothe the tissues.  Use the Atrovent  nasal spray, 2 squirts in each nostril every 6 hours, as needed for runny nose and postnasal drip.  Use the Tessalon  Perles every 8 hours during the day.  Take them with a small sip of water.  They may give you some numbness to the base of your tongue or a metallic taste in your mouth, this is normal.  Use over-the-counter cough preparations of your choice to help with cough and  congestion.  Return for reevaluation or see your primary care provider for any new or worsening symptoms.    ED Prescriptions     Medication Sig Dispense Auth. Provider   benzonatate  (TESSALON ) 100 MG capsule Take 2 capsules (200 mg total) by mouth every 8 (eight) hours. 21 capsule Kent Pear, NP   ipratropium (ATROVENT ) 0.06 % nasal spray Place 2 sprays into both nostrils 4 (four) times daily. 15 mL Kent Pear, NP      PDMP not reviewed this encounter.   Kent Pear, NP 03/07/24 1946

## 2024-03-07 NOTE — Discharge Instructions (Addendum)
 Your strep test today is negative.  I do believe you have a respiratory virus which is causing your symptoms.  To help with your laryngitis I recommend voice rest.  The last you strain your voice last inflammation is present and the past your voice will return.  Sip on cool or warm beverages, whichever feels better.  You may also use over-the-counter Chloraseptic or Sucrets lozenges to help soothe your throat.  No more than 1 lozenge every 2 hours as the menthol may give you diarrhea.  Salt water gargles can also be helpful to limit drainage in the back of your throat and soothe the tissues.  Use the Atrovent  nasal spray, 2 squirts in each nostril every 6 hours, as needed for runny nose and postnasal drip.  Use the Tessalon  Perles every 8 hours during the day.  Take them with a small sip of water.  They may give you some numbness to the base of your tongue or a metallic taste in your mouth, this is normal.  Use over-the-counter cough preparations of your choice to help with cough and congestion.  Return for reevaluation or see your primary care provider for any new or worsening symptoms.

## 2024-03-07 NOTE — ED Triage Notes (Signed)
 Pt c/o nasal drainage, loss of voice, red throat with white patches x1day  Pt states that she is having pain when she swallows.

## 2024-08-22 ENCOUNTER — Ambulatory Visit
Admission: RE | Admit: 2024-08-22 | Discharge: 2024-08-22 | Disposition: A | Discharge status: Home / Self Care | Attending: Emergency Medicine | Admitting: Emergency Medicine

## 2024-08-22 VITALS — BP 121/86 | HR 80 | Temp 98.7°F | Resp 16

## 2024-08-22 DIAGNOSIS — J069 Acute upper respiratory infection, unspecified: Secondary | ICD-10-CM

## 2024-08-22 LAB — GROUP A STREP BY PCR: Group A Strep by PCR: NOT DETECTED

## 2024-08-22 LAB — SARS CORONAVIRUS 2 BY RT PCR: SARS Coronavirus 2 by RT PCR: NEGATIVE

## 2024-08-22 MED ORDER — AMOXICILLIN-POT CLAVULANATE 875-125 MG PO TABS: 1.0000 | ORAL_TABLET | Freq: Two times a day (BID) | ORAL | 0 refills | Status: AC

## 2024-08-22 MED ORDER — GUAIFENESIN-CODEINE 100-10 MG/5ML PO SOLN: 5.0000 mL | Freq: Four times a day (QID) | ORAL | 0 refills | Status: AC | PRN

## 2024-08-22 MED ORDER — IPRATROPIUM BROMIDE 0.06 % NA SOLN
2.0000 | Freq: Four times a day (QID) | NASAL | 12 refills | Status: AC
Start: 2024-08-22 — End: ?

## 2024-08-22 MED ORDER — BENZONATATE 100 MG PO CAPS: 200.0000 mg | ORAL_CAPSULE | Freq: Three times a day (TID) | ORAL | 0 refills | Status: AC

## 2024-08-22 NOTE — ED Triage Notes (Signed)
 Congestion, chills, cough, sore throat x 3 days. Not taking any OTC medications at this time.

## 2024-08-22 NOTE — ED Provider Notes (Signed)
 [facility]    CSN: [id number] Arrival date & time: 08/22/24  1740      History   Chief Complaint Chief Complaint  Patient presents with   URI    Mom recently diagnosed with bronchitis and pneumonia - Entered by patient   Cough   Nasal Congestion    HPI Theresa [name] is a 28 y.o. female.   HPI  84 old female with past medical history significant for anxiety, depression, dysmenorrhea, and frequent headaches who presents for evaluation of 3 days with respiratory symptoms that include chills, nasal congestion with thick yellow nasal discharge sore throat, productive cough for yellow sputum, and fever with a Tmax of 101.2 last night.  She has had some dull ear pain but denies shortness of breath or wheezing.  Her mom has been sick with similar symptoms and was recently diagnosed with bronchitis and pneumonia.  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  Past Medical History:  Diagnosis Date   Anxiety    h/o no meds   Complication of anesthesia    Slow to avaken- age 34   Depression    h/o no meds   Dysmenorrhea    Frequent headaches    frequent bad ha'sdue to sinus issues   History of hiatal hernia     Patient Active Problem List   Diagnosis Date Noted   Upper respiratory tract infection 10/03/2023   Panniculitis 01/26/2023   Grief reaction 11/06/2016   Generalized anxiety disorder 09/27/2016   Snoring 09/27/2016   Menstrual irregularity 07/10/2016   Obesity 07/10/2016   Cervical cancer screening 07/10/2016   Encounter for immunization 07/10/2016   Encounter for preconception consultation 07/10/2016    Past Surgical History:  Procedure Laterality Date   ABDOMINAL SURGERY     BREAST REDUCTION SURGERY  12/2013   BREAST SURGERY     GASTROPLASTY DUODENAL SWITCH     2020   LIPOSUCTION N/A 05/31/2023   Procedure: LIPOSUCTION;  Surgeon: Lowery Estefana RAMAN, DO;  Location: [facility];  Service: Plastics;  Laterality: N/A;   PANNICULECTOMY N/A  05/31/2023   Procedure: PANNICULECTOMY;  Surgeon: Lowery Estefana RAMAN, DO;  Location: [facility];  Service: Plastics;  Laterality: N/A;   TONSILLECTOMY Bilateral 01/14/2019   Procedure: TONSILLECTOMY;  Surgeon: Edda Mt, MD;  Location: [facility];  Service: ENT;  Laterality: Bilateral;   TUBAL LIGATION  2023   WISDOM TOOTH EXTRACTION      OB History     Gravida  1   Para  0   Term  0   Preterm  0   AB  0   Living  0      SAB  0   IAB  0   Ectopic  0   Multiple  0   Live Births  0            Home Medications    Prior to Admission medications   Medication Sig Start Date End Date Taking? Authorizing Provider  amoxicillin -clavulanate (AUGMENTIN ) 875-125 MG tablet Take 1 tablet by mouth every 12 (twelve) hours for 7 days. 08/22/24 08/29/24 Yes Bernardino Ditch, NP  atomoxetine (STRATTERA) 25 MG capsule Take 25 mg by mouth daily. 09/22/23  Yes [provider]  benzonatate  (TESSALON ) 100 MG capsule Take 2 capsules (200 mg total) by mouth every 8 (eight) hours. 08/22/24  Yes Bernardino Ditch, NP  busPIRone (BUSPAR) 10 MG tablet Take by mouth. 07/23/24  Yes [provider]  guaiFENesin -codeine  100-10  MG/5ML syrup Take 5 mLs by mouth every 6 (six) hours as needed for cough. 08/22/24  Yes Bernardino Ditch, NP  ipratropium (ATROVENT ) 0.06 % nasal spray Place 2 sprays into both nostrils 4 (four) times daily. 08/22/24  Yes Bernardino Ditch, NP  sertraline  (ZOLOFT ) 25 MG tablet Take by mouth. 09/05/23  Yes [provider]  WEGOVY 2.4 MG/0.75ML SOAJ SQ injection Inject 2.4 mg into the skin. 07/08/24 07/07/25 Yes [provider]  ALTAVERA 0.15-30 MG-MCG tablet Take 1 tablet by mouth every morning. 03/10/22   [provider]  azelastine  (ASTELIN ) 0.1 % nasal spray Place 1 spray into both nostrils 2 (two) times daily for 7 days. Use in each nostril as directed 10/01/23 10/08/23  Rolan Berthold, PA-C  azithromycin  (ZITHROMAX  Z-PAK) 250 MG tablet Take as package  directed 10/03/23   Defelice, Rilla, NP  brompheniramine-pseudoephedrine -DM 30-2-10 MG/5ML syrup Take 5 mLs by mouth 4 (four) times daily as needed. 09/21/23   Bernardino Ditch, NP  escitalopram (LEXAPRO) 10 MG tablet Take 20 mg by mouth every morning. 05/12/22   [provider]  guanFACINE (TENEX) 1 MG tablet Take 1 mg by mouth at bedtime. 09/05/23   [provider]  hydrOXYzine (ATARAX) 25 MG tablet Take 25 mg by mouth 3 (three) times daily as needed for anxiety. 05/17/22   [provider]  levonorgestrel (LILETTA) 20.1 MCG/DAY IUD IUD 1 each by Intrauterine route once.    [provider]  ondansetron  (ZOFRAN ) 4 MG tablet Take 1 tablet (4 mg total) by mouth every 8 (eight) hours as needed for nausea or vomiting. 05/19/23   Hedges, Reyes, PA-C    Family History Family History  Problem Relation Age of Onset   Depression Mother    Anxiety disorder Mother    Thyroid  disease Mother    Diabetes Paternal Grandmother    COPD Paternal Grandfather     Social History Social History   Tobacco Use   Smoking status: Never    Passive exposure: Never   Smokeless tobacco: Never  Vaping Use   Vaping status: Never Used  Substance Use Topics   Alcohol use: Yes    Alcohol/week: 3.0 standard drinks of alcohol    Types: 3 Cans of beer per week   Drug use: Never     Allergies   Ibuprofen  and Promethazine   Review of Systems Review of Systems  Constitutional:  Positive for chills and fever.  HENT:  Positive for congestion, ear pain, rhinorrhea and sore throat.   Respiratory:  Positive for cough. Negative for shortness of breath and wheezing.      Physical Exam Triage Vital Signs ED Triage Vitals  Encounter Vitals Group     BP      Girls Systolic BP Percentile      Girls Diastolic BP Percentile      Boys Systolic BP Percentile      Boys Diastolic BP Percentile      Pulse      Resp      Temp      Temp src      SpO2      Weight      Height       Head Circumference      Peak Flow      Pain Score      Pain Loc      Pain Education      Exclude from Growth Chart    No data found.  Updated Vital Signs BP  121/86 (BP Location: Left Arm)   Pulse 80   Temp 98.7 F (37.1 C) (Oral)   Resp 16   LMP 06/12/2024 (Approximate)   SpO2 97%   Visual Acuity Right Eye Distance:   Left Eye Distance:   Bilateral Distance:    Right Eye Near:   Left Eye Near:    Bilateral Near:     Physical Exam Vitals and nursing note reviewed.  Constitutional:      Appearance: Normal appearance. She is ill-appearing.  HENT:     Head: Normocephalic and atraumatic.     Right Ear: Tympanic membrane, ear canal and external ear normal. There is no impacted cerumen.     Left Ear: Tympanic membrane, ear canal and external ear normal. There is no impacted cerumen.     Nose: Congestion and rhinorrhea present.     Comments: Patient mucosa is edematous and erythematous with thick yellow discharge in both nares.    Mouth/Throat:     Mouth: Mucous membranes are moist.     Pharynx: Oropharynx is clear. Posterior oropharyngeal erythema present. No oropharyngeal exudate.     Comments: Tonsillar pillars are surgically absent.  Posterior.  Significant erythema and injection with thick yellow postnasal drip. Cardiovascular:     Rate and Rhythm: Normal rate and regular rhythm.     Pulses: Normal pulses.     Heart sounds: Normal heart sounds. No murmur heard.    No friction rub. No gallop.  Pulmonary:     Effort: Pulmonary effort is normal.     Breath sounds: Normal breath sounds. No wheezing, rhonchi or rales.  Musculoskeletal:     Cervical back: Normal range of motion and neck supple. No tenderness.  Lymphadenopathy:     Cervical: No cervical adenopathy.  Skin:    General: Skin is warm and dry.     Capillary Refill: Capillary refill takes less than 2 seconds.     Findings: No rash.  Neurological:     General: No focal deficit present.     Mental Status: She  is alert and oriented to person, place, and time.      UC Treatments / Results  Labs (all labs ordered are listed, but only abnormal results are displayed) Labs Reviewed  GROUP A STREP BY PCR  SARS CORONAVIRUS 2 BY RT PCR    EKG   Radiology No results found.  Procedures Procedures (including critical care time)  Medications Ordered in UC Medications - No data to display  Initial Impression / Assessment and Plan / UC Course  I have reviewed the triage vital signs and the nursing notes.  Pertinent labs & imaging results that were available during my care of the patient were reviewed by me and considered in my medical decision making (see chart for details).   Patient is a pleasant, though ill-appearing, 28 year old female presenting for evaluation of respiratory symptoms as outlined in HPI her most significant symptom is her sore throat.  She reports that she woke up this morning and her throat was so painful it is hard for her to talk.  She is concerned because her mom started similar symptoms several weeks ago and then came in over the weekend and was diagnosed with pneumonia and bronchitis.  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  She is unaware of any sick contacts that she is around more of the public that she has been previously.  Her physical exam general  information for respiratory tract with thick yellow discharge in both nares as well as erythema and injection to the posterior pharynx with thick yellow postnasal drip.  Her cardiopulmonary exam is benign.  Differential diagnose include COVID, influenza, strep pharyngitis, viral respiratory illness.  Due to the fact that she is on day 3 of symptoms I will not test her for influenza at this time but I will order a COVID PCR as well as a strep PCR.  Strep PCR is negative.  COVID PCR is negative.  I will discharge patient on the diagnosis of URI with cough and congestion.  I will  discharge her home on Augmentin  875 twice daily for 10 days for treatment of her URI.  Atrovent  nasal spray for nasal congestion.  Tessalon  Perles for cough during the day and Cheratussin cough syrup for cough at bedtime as patient has an allergy to promethazine.  No open prescription in PDMP.   Final Clinical Impressions(s) / UC Diagnoses   Final diagnoses:  URI with cough and congestion     Discharge Instructions      Your testing today was negative for COVID and strep but you do have an upper respiratory tract infection.  Take Augmentin  twice daily with food for 7 days for treatment of your URI.  Use the Atrovent  nasal spray, 2 squirts each nostril every 6 hours, as needed for runny nose or nasal congestion.  Use the Tessalon  Perles every 8 hours during the day as needed for cough.  Use the Cheratussin cough syrup at bedtime as needed for cough and congestion.  You may take 5 to 10 mL.  If you develop any new or worsening symptoms please return for reevaluation or follow-up with your primary care provider.     ED Prescriptions     Medication Sig Dispense Auth. Provider   amoxicillin -clavulanate (AUGMENTIN ) 875-125 MG tablet Take 1 tablet by mouth every 12 (twelve) hours for 7 days. 14 tablet Bernardino Ditch, NP   benzonatate  (TESSALON ) 100 MG capsule Take 2 capsules (200 mg total) by mouth every 8 (eight) hours. 21 capsule Bernardino Ditch, NP   ipratropium (ATROVENT ) 0.06 % nasal spray Place 2 sprays into both nostrils 4 (four) times daily. 15 mL Bernardino Ditch, NP   guaiFENesin -codeine  100-10 MG/5ML syrup Take 5 mLs by mouth every 6 (six) hours as needed for cough. 120 mL Bernardino Ditch, NP      I have reviewed the PDMP during this encounter.   Bernardino Ditch, NP 08/22/24 [phone]

## 2024-08-22 NOTE — Discharge Instructions (Addendum)
 Your testing today was negative for COVID and strep but you do have an upper respiratory tract infection.  Take Augmentin  twice daily with food for 7 days for treatment of your URI.  Use the Atrovent  nasal spray, 2 squirts each nostril every 6 hours, as needed for runny nose or nasal congestion.  Use the Tessalon  Perles every 8 hours during the day as needed for cough.  Use the Cheratussin cough syrup at bedtime as needed for cough and congestion.  You may take 5 to 10 mL.  If you develop any new or worsening symptoms please return for reevaluation or follow-up with your primary care provider.

## 2024-11-22 ENCOUNTER — Institutional Professional Consult (permissible substitution)
